# Patient Record
Sex: Male | Born: 1965 | Race: White | Hispanic: No | Marital: Married | Smoking: Never smoker
Health system: Southern US, Community
[De-identification: ages and names within clinical notes are randomized; demographics above are authoritative.]

## PROBLEM LIST (undated history)

## (undated) DIAGNOSIS — F419 Anxiety disorder, unspecified: Secondary | ICD-10-CM

## (undated) DIAGNOSIS — J339 Nasal polyp, unspecified: Secondary | ICD-10-CM

## (undated) DIAGNOSIS — M359 Systemic involvement of connective tissue, unspecified: Secondary | ICD-10-CM

## (undated) DIAGNOSIS — M069 Rheumatoid arthritis, unspecified: Secondary | ICD-10-CM

## (undated) DIAGNOSIS — E119 Type 2 diabetes mellitus without complications: Secondary | ICD-10-CM

## (undated) HISTORY — PX: WRIST SURGERY: SHX841

## (undated) HISTORY — PX: FOOT SURGERY: SHX648

## (undated) HISTORY — PX: NASAL POLYP EXCISION: SHX2068

---

## 2006-05-04 ENCOUNTER — Emergency Department: Payer: Self-pay | Admitting: Emergency Medicine

## 2006-12-28 ENCOUNTER — Ambulatory Visit: Payer: Self-pay | Admitting: Unknown Physician Specialty

## 2007-01-04 ENCOUNTER — Ambulatory Visit: Payer: Self-pay | Admitting: Unknown Physician Specialty

## 2007-01-27 ENCOUNTER — Emergency Department: Payer: Self-pay | Admitting: Unknown Physician Specialty

## 2007-03-07 ENCOUNTER — Ambulatory Visit: Payer: Self-pay | Admitting: Unknown Physician Specialty

## 2011-04-27 ENCOUNTER — Ambulatory Visit: Payer: Self-pay | Admitting: Internal Medicine

## 2011-07-24 ENCOUNTER — Ambulatory Visit: Payer: Self-pay | Admitting: Anesthesiology

## 2011-07-27 ENCOUNTER — Ambulatory Visit: Payer: Self-pay | Admitting: Otolaryngology

## 2011-08-05 LAB — WOUND CULTURE

## 2012-08-21 ENCOUNTER — Ambulatory Visit: Payer: Self-pay

## 2012-09-10 ENCOUNTER — Ambulatory Visit: Payer: Self-pay

## 2012-10-10 ENCOUNTER — Ambulatory Visit: Payer: Self-pay

## 2013-02-19 ENCOUNTER — Emergency Department: Payer: Self-pay | Admitting: Emergency Medicine

## 2013-06-19 ENCOUNTER — Ambulatory Visit: Payer: Self-pay | Admitting: Podiatry

## 2014-10-04 NOTE — Op Note (Signed)
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   POSTOPERATIVE DIAGNOSES: 1. 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 2. In addition, there was a cerebrospinal fluid leak from the right fovea ethmoidalis.  PROCEDURES:  1. Image-guided sinus surgery.  2. Revision bilateral frontal sinusotomies with tissue removal.  3. Revision bilateral anterior and posterior ethmoidectomies with tissue removal.  4. Revision bilateral sphenoidectomies with tissue removal.  5. Revision bilateral maxillary sinus antrostomies with tissue removal.  6. Repair of CSF leak at the right fovea ethmoidalis.   SURGEON: Janalee Dane, MD   DESCRIPTION OF PROCEDURE: The image-guided sinus surgery system mask was attached and registration was carried out in the standard fashion using the appropriate fiduciary points. Calibration of the system was confirmed and extensive review of the CT scan in all three dimensions preoperatively and intraoperatively was carried out.  Each instrument was registered and confirmed for anatomic accuracy.  The left side was addressed first. The sinuses were completely full of fibrous polyps and essentially no normal landmarks until removal of the polyps had been undertaken and navigation was very helpful in developing particularly the ethmoid cavity preserving the lamina papyracea and skull base. The polyps were removed with through-cutting forceps and the Diego powered instrumentation. The maxillary antrostomy was enlarged with a large side-cutting through cut instrument. After the ethmoid and maxillary sinus had been developed, the sphenoid sinus ostium was identified and progressively enlarged with Kerrison and through-cutting forceps. The 45 degree scope was used to perform the frontal recess.  The frontal recess was progressively enlarged to create a large frontal sinusotomy. The frontal ethmoid, maxillary, and sphenoid sinuses were copiously irrigated with saline and temporary Phenylephrine lidocaine soaked Telfa pledgets were placed.   Attention was directed to the right side. An identical procedure was performed in the ethmoid, maxillary, and sphenoid sinuses. The frontal sinus recess was again addressed with a 45 degree scope. As the frontal recess was developed, a CSF leak was identified behind a polyp that was based at the right fovea ethmoidalis. A flap of reasonably normal looking mucosa with a free bone graft was used to repair the leak. The leak was fairly small and the bone graft fit nicely in the defect with the flap presumably middle turbinate mucosa base posteriorly. Once the CSF leak had been repaired, it was reinforced with Surgiflo. No further leak was present. Therefore, the nose was lightly packed with Telfa pledgets tied over the columella.   The patient was returned to anesthesia, allowed to emerge from anesthesia in the operating room, and taken to the recovery room in stable condition. There were no complications. Estimated blood loss approximately 100 mL.   ____________________________ J. Nadeen Landau, MD [id number] D: 07/27/2011 21:41:43 ET T: 07/28/2011 10:35:33 ET JOB#: [id number]  cc: Janalee Dane, MD, <Dictator> Nicholos Johns MD ELECTRONICALLY SIGNED 08/14/2011 10:02

## 2016-09-29 ENCOUNTER — Encounter: Admission: RE | Payer: Self-pay | Source: Ambulatory Visit

## 2016-09-29 ENCOUNTER — Ambulatory Visit
Admission: RE | Admit: 2016-09-29 | Payer: BLUE CROSS/BLUE SHIELD | Source: Ambulatory Visit | Admitting: Gastroenterology

## 2016-09-29 SURGERY — COLONOSCOPY WITH PROPOFOL
Anesthesia: General

## 2016-10-02 ENCOUNTER — Emergency Department
Admission: EM | Admit: 2016-10-02 | Discharge: 2016-10-02 | Disposition: A | Discharge status: Home / Self Care | Payer: BLUE CROSS/BLUE SHIELD | Attending: Emergency Medicine | Admitting: Emergency Medicine

## 2016-10-02 ENCOUNTER — Emergency Department: Payer: BLUE CROSS/BLUE SHIELD

## 2016-10-02 ENCOUNTER — Encounter: Payer: Self-pay | Admitting: Emergency Medicine

## 2016-10-02 DIAGNOSIS — E876 Hypokalemia: Secondary | ICD-10-CM | POA: Insufficient documentation

## 2016-10-02 DIAGNOSIS — J329 Chronic sinusitis, unspecified: Secondary | ICD-10-CM | POA: Insufficient documentation

## 2016-10-02 DIAGNOSIS — E119 Type 2 diabetes mellitus without complications: Secondary | ICD-10-CM | POA: Insufficient documentation

## 2016-10-02 DIAGNOSIS — R232 Flushing: Secondary | ICD-10-CM | POA: Diagnosis not present

## 2016-10-02 DIAGNOSIS — Z7984 Long term (current) use of oral hypoglycemic drugs: Secondary | ICD-10-CM | POA: Diagnosis not present

## 2016-10-02 DIAGNOSIS — R202 Paresthesia of skin: Secondary | ICD-10-CM

## 2016-10-02 DIAGNOSIS — G8929 Other chronic pain: Secondary | ICD-10-CM | POA: Diagnosis not present

## 2016-10-02 DIAGNOSIS — M549 Dorsalgia, unspecified: Secondary | ICD-10-CM | POA: Insufficient documentation

## 2016-10-02 DIAGNOSIS — D1800 Hemangioma unspecified site: Secondary | ICD-10-CM | POA: Insufficient documentation

## 2016-10-02 HISTORY — DX: Type 2 diabetes mellitus without complications: E11.9

## 2016-10-02 HISTORY — DX: Rheumatoid arthritis, unspecified: M06.9

## 2016-10-02 HISTORY — DX: Anxiety disorder, unspecified: F41.9

## 2016-10-02 LAB — CBC WITH DIFFERENTIAL/PLATELET
Basophils Absolute: 0 10*3/uL (ref 0–0.1)
Basophils Relative: 1 %
EOS ABS: 0.1 10*3/uL (ref 0–0.7)
EOS PCT: 2 %
HCT: 45 % (ref 40.0–52.0)
Hemoglobin: 15.6 g/dL (ref 13.0–18.0)
LYMPHS ABS: 0.6 10*3/uL — AB (ref 1.0–3.6)
LYMPHS PCT: 14 %
MCH: 28.3 pg (ref 26.0–34.0)
MCHC: 34.5 g/dL (ref 32.0–36.0)
MCV: 82 fL (ref 80.0–100.0)
MONO ABS: 0.9 10*3/uL (ref 0.2–1.0)
Monocytes Relative: 19 %
Neutro Abs: 3 10*3/uL (ref 1.4–6.5)
Neutrophils Relative %: 64 %
PLATELETS: 141 10*3/uL — AB (ref 150–440)
RBC: 5.5 MIL/uL (ref 4.40–5.90)
RDW: 14.7 % — AB (ref 11.5–14.5)
WBC: 4.6 10*3/uL (ref 3.8–10.6)

## 2016-10-02 LAB — COMPREHENSIVE METABOLIC PANEL
ALT: 40 U/L (ref 17–63)
AST: 38 U/L (ref 15–41)
Albumin: 4.3 g/dL (ref 3.5–5.0)
Alkaline Phosphatase: 66 U/L (ref 38–126)
Anion gap: 10 (ref 5–15)
BUN: 15 mg/dL (ref 6–20)
CHLORIDE: 101 mmol/L (ref 101–111)
CO2: 26 mmol/L (ref 22–32)
CREATININE: 1.05 mg/dL (ref 0.61–1.24)
Calcium: 9 mg/dL (ref 8.9–10.3)
Glucose, Bld: 108 mg/dL — ABNORMAL HIGH (ref 65–99)
POTASSIUM: 2.9 mmol/L — AB (ref 3.5–5.1)
Sodium: 137 mmol/L (ref 135–145)
TOTAL PROTEIN: 7.8 g/dL (ref 6.5–8.1)
Total Bilirubin: 1.6 mg/dL — ABNORMAL HIGH (ref 0.3–1.2)

## 2016-10-02 LAB — ETHANOL

## 2016-10-02 LAB — URINE DRUG SCREEN, QUALITATIVE (ARMC ONLY)
AMPHETAMINES, UR SCREEN: NOT DETECTED
BARBITURATES, UR SCREEN: NOT DETECTED
BENZODIAZEPINE, UR SCRN: NOT DETECTED
Cannabinoid 50 Ng, Ur ~~LOC~~: NOT DETECTED
Cocaine Metabolite,Ur ~~LOC~~: NOT DETECTED
MDMA (Ecstasy)Ur Screen: NOT DETECTED
METHADONE SCREEN, URINE: NOT DETECTED
OPIATE, UR SCREEN: NOT DETECTED
Phencyclidine (PCP) Ur S: NOT DETECTED
TRICYCLIC, UR SCREEN: NOT DETECTED

## 2016-10-02 LAB — URINALYSIS, COMPLETE (UACMP) WITH MICROSCOPIC
BACTERIA UA: NONE SEEN
BILIRUBIN URINE: NEGATIVE
Glucose, UA: NEGATIVE mg/dL
Hgb urine dipstick: NEGATIVE
KETONES UR: NEGATIVE mg/dL
LEUKOCYTES UA: NEGATIVE
Nitrite: NEGATIVE
PH: 7 (ref 5.0–8.0)
PROTEIN: NEGATIVE mg/dL
SQUAMOUS EPITHELIAL / LPF: NONE SEEN
Specific Gravity, Urine: 1.012 (ref 1.005–1.030)

## 2016-10-02 LAB — TROPONIN I: Troponin I: <0.03 ng/mL (ref <0.03)

## 2016-10-02 LAB — LACTIC ACID, PLASMA
Lactic Acid, Venous: 2.2 mmol/L (ref 0.5–1.9)
Lactic Acid, Venous: 1.2 mmol/L (ref 0.5–1.9)

## 2016-10-02 LAB — MAGNESIUM: MAGNESIUM: 2 mg/dL (ref 1.7–2.4)

## 2016-10-02 LAB — GLUCOSE, CAPILLARY: Glucose-Capillary: 99 mg/dL (ref 65–99)

## 2016-10-02 MED ORDER — ACETAMINOPHEN 325 MG PO TABS
650.0000 mg | ORAL_TABLET | Freq: Once | ORAL | Status: AC
  Administered 2016-10-02: 650 mg via ORAL
  Filled 2016-10-02: qty 2

## 2016-10-02 MED ORDER — GADOBENATE DIMEGLUMINE 529 MG/ML IV SOLN
20.0000 mL | Freq: Once | INTRAVENOUS | Status: AC | PRN
  Administered 2016-10-02: 20 mL via INTRAVENOUS

## 2016-10-02 MED ORDER — POTASSIUM CHLORIDE CRYS ER 20 MEQ PO TBCR
40.0000 meq | EXTENDED_RELEASE_TABLET | Freq: Once | ORAL | Status: AC
  Administered 2016-10-02: 40 meq via ORAL
  Filled 2016-10-02: qty 2

## 2016-10-02 MED ORDER — SODIUM CHLORIDE 0.9 % IV BOLUS (SEPSIS)
1000.0000 mL | Freq: Once | INTRAVENOUS | Status: AC
Start: 2016-10-02 — End: 2016-10-02
  Administered 2016-10-02: 1000 mL via INTRAVENOUS

## 2016-10-02 NOTE — ED Notes (Signed)
Patient transported to MRI 

## 2016-10-02 NOTE — ED Notes (Signed)
ED Provider at bedside. 

## 2016-10-02 NOTE — ED Notes (Signed)
Pt sent from [place] for elav of possible prednisone induced dka. Pt alert.  Skin warm and dry  Pt states I feel numb  No chest pain or sob. Family with pt

## 2016-10-02 NOTE — Discharge Instructions (Signed)
Do not take prednisone, as he seemed not to be able to tolerated. Return to the emergency room for any new or worrisome symptoms including numbness or weakness, seizure, increased pain, shortness of breath, greenish discharge from your nasal cavities or you feel worse in any way. Follow closely with Dr. 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 If you feel unwell in any significant way we encourage and advised you  to return to the emergency department.

## 2016-10-02 NOTE — ED Notes (Signed)
Patient transported to X-ray 

## 2016-10-02 NOTE — ED Provider Notes (Addendum)
[facility] Emergency Department Provider Note  ____________________________________________   I have reviewed the triage vital signs and the nursing notes.   HISTORY  Chief Complaint Numbness and Back Pain    HPI John Stanton. is a 51 y.o. male who presents today with a host of different complaints. He was recent started on prednisone and hasn't had a few doses and he feels terrible he states. He feels washed out. He states that he has no energy. He does not want to eat or drink. Denies any fever or chills. Denies he nausea vomiting or diarrhea. Denies any numbness or weakness. Present was started for his sciatic pain. Sciatic is much better. Has chronic back pain, that feels better too. He states "I am not here for that" He denies any neurologic deficits such as incontinence of bowel or bladder. He states he did have tingling in both his fingertips on both sides but that's gone now. He states he was briefly constipated, well taking Percocet however he took MiraLAX and that is better. He has chronic sinus issues, he states he had a recent nosebleed but that stopped. He states he's had 2 sinus surgeries. He was on antibiotics earlier this months for sinus issues, and he feels that that is better. He has had a slight cough but no shortness of breath and no chest pain pleuritic or otherwise. Denies any lower extremity swelling. He states he just does not feel right. He is sent over here for rule out DKA according to notes but his glucose is 99. He has not had polyuria or polydipsia. He has chronic recurrent headaches he states for years and he is having a headache. Not worst headache of life, gradual onset. He denies any change in his chronic headaches and states this is a mild headache for him. He has been seen for his headaches in the past she states although he does not recall having imaging. He denies any change in vision although he states colors do not feel as vibrant  to him since he started taking the prednisone. He had no true allergic symptoms such as anaphylaxis or hives but he was reported to feel somewhat "flushed" after taking a shower yesterday which he attributes to the prednisone.     Past Medical History:  Diagnosis Date  . Anxiety   . Diabetes mellitus without complication ([place])   . Rheumatoid arthritis ([employer])     There are no active problems to display for this patient.   History reviewed. No pertinent surgical history.  Prior to Admission medications   Not on File    Allergies Patient has no allergy information on record.  No family history on file.  Social History Social History  Substance Use Topics  . Smoking status: Never Smoker  . Smokeless tobacco: Never Used  . Alcohol use No    Review of Systems Constitutional: No fever/chills Eyes: Blurry vision but does complain of colors seeming less fire ant ENT: No sore throat. No stiff neck no neck pain, no purulent nasal drainage, chronic sinus issues which seem to be in remission to him, had a nosebleed recently Cardiovascular: Denies chest pain. Respiratory: Denies shortness of breath. Said a slight cough Gastrointestinal:   no vomiting.  No diarrhea.  No constipation. Genitourinary: Negative for dysuria. Musculoskeletal: Negative lower extremity swelling Skin: Negative for rash. Neurological: Negative for severe headaches, focal weakness or numbness. 10-point ROS otherwise negative.  ____________________________________________   PHYSICAL EXAM:  VITAL SIGNS: ED Triage  Vitals [10/02/16 1715]  Enc Vitals Group     BP 135/80     Pulse Rate 76     Resp 17     Temp      Temp src      SpO2 96 %     Weight      Height      Head Circumference      Peak Flow      Pain Score      Pain Loc      Pain Edu?      Excl. in [place]?     Constitutional: Alert and oriented. Well appearing Nontoxic in appearance but seems anxious and upset  Eyes: Conjunctivae are  normal. PERRL. EOMI. Head: Atraumatic. Nose: No congestion/rhinnorhea. Mouth/Throat: Mucous membranes are moist.  Oropharynx non-erythematous. Neck: No stridor.   Nontender with no meningismus Cardiovascular: Normal rate, regular rhythm. Grossly normal heart sounds.  Good peripheral circulation. Respiratory: Normal respiratory effort.  No retractions. Lungs CTAB. Abdominal: Soft and nontender. No distention. No guarding no rebound Back:  There is no focal tenderness or step off.  there is no midline tenderness there are no lesions noted. there is no CVA tenderness Musculoskeletal: No lower extremity tenderness, no upper extremity tenderness. No joint effusions, no DVT signs strong distal pulses no edema Neurologic:  Normal speech and language. No gross focal neurologic deficits are appreciated.  Skin:  Skin is warm, dry and intact. No rash noted. Psychiatric: Mood and affect are normal. Speech and behavior are normal.  ____________________________________________   LABS (all labs ordered are listed, but only abnormal results are displayed)  Labs Reviewed  COMPREHENSIVE METABOLIC PANEL - Abnormal; Notable for the following:       Result Value   Potassium 2.9 (*)    Glucose, Bld 108 (*)    Total Bilirubin 1.6 (*)    All other components within normal limits  CBC WITH DIFFERENTIAL/PLATELET - Abnormal; Notable for the following:    RDW 14.7 (*)    Platelets 141 (*)    Lymphs Abs 0.6 (*)    All other components within normal limits  LACTIC ACID, PLASMA - Abnormal; Notable for the following:    Lactic Acid, Venous 2.2 (*)    All other components within normal limits  URINE CULTURE  GLUCOSE, CAPILLARY  ETHANOL  TROPONIN I  LACTIC ACID, PLASMA  URINALYSIS, COMPLETE (UACMP) WITH MICROSCOPIC  URINE DRUG SCREEN, QUALITATIVE ([facility] ONLY)  MAGNESIUM   ____________________________________________  EKG  I personally interpreted any EKGs ordered by me or triage Sinus rhythm rate 7077  acute ST elevation or acute ST depression normal axis, unremarkable EKG ____________________________________________  RADIOLOGY  I reviewed any imaging ordered by me or triage that were performed during my shift and, if possible, patient and/or family made aware of any abnormal findings. ____________________________________________   PROCEDURES  Procedure(s) performed: None  Procedures  Critical Care performed: None  ____________________________________________   INITIAL IMPRESSION / ASSESSMENT AND PLAN / ED COURSE  Pertinent labs & imaging results that were available during my care of the patient were reviewed by me and considered in my medical decision making (see chart for details).  Patient had a multiple different complaints most of them are somewhat chronic which she recently recent administration of prednisone. Patient does not have any evidence of anaphylaxis or significant allergic reaction. Given the very vague and diffuse nature of his complaints at a broad workup. I did a CT of his sinuses which does show what  is likely chronic sinus disease. Patient has had considerable sinus surgery and has no symptoms of ongoing sinusitis and recently was on antibiotics. He states when he has sinus disease he knows. There is no abscess. He feels he is at his baseline this first back. I don't think that necessarily finding thickening of the mucosal etc. is necessarily indicative of the need for antibiotics in this context. Patient's electrolyte are reassuring aside from his potassium which we're replacing. His bili is slightly elevated above baseline but not dangerously so, his abdomen is completely benign, CBC is reassuring. Troponin is negative despite symptoms since Saturday, ethanol is reassuring, chest x-ray is normal despite complaints of cough, patient does have a small 1 cm x 1 cm x 1 cm mass in his brain which I think is a completely incidental finding and not likely related to this  multiple different complaints. There is neurologically intact. His headache is slight and chronic. He may require an MRI not certain and has to happen today. Patient has had half a liter of fluid thus far and feels much better. No evidence of anaphylaxis we will continue to observe him. He does endorse feeling a great deal of improvement with fluids.  ----------------------------------------- 6:49 PM on 10/02/2016 -----------------------------------------  This he continues to feel better I did discuss with Dr. Lacinda Axon of neurosurgery who did request that I do an MRI of the head with and without contrast to further characterize this lesion which we will do. Again I think this is most likely an incidental finding. Nonetheless, we will comply with neurosurgical recommendation. Urinalysis is pending. Patient vital signs are reassuring and he is in no acute distress    ----------------------------------------- 9:19 PM on 10/02/2016 -----------------------------------------  Patient states he feels "just fine". I don't think that acute antibiotics are necessary for his chronic sinusitis even though there are clearly inflammatory changes is no evidence of active ongoing infection or abscess. 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 I did discuss with Dr. Lacinda Axon who agrees with discharge and will see him as an outpatient no further intervention is required for that. Patient apparently was feeling flushed after taking prednisone. I've advised him not to take it. He remains neurologically intact with no abdominal pain or discomfort no shortness of breath no chest pain, and extensive workup here is very reassuring. Patient's potassium is slightly low, we have begun to replete that here and we will send him home with supplementary potassium at home. Administration will follow closely with Dr. Doy Hutching tomorrow and he will follow closely with neurosurgery. He understands if he has a seizure or some  other new or concerning symptoms related to the cavernoma, which we don't anticipate given that was an incidental finding, he will return. He states after IV fluids most of his symptoms have resolved and he would like to go home. Patient for a to be admitted at this time and I don't see any clear indication to admit him anyway. We'll discharge him with outpatient follow-up and extensive return precautions given and understood.   ____________________________________________   FINAL CLINICAL IMPRESSION(S) / ED DIAGNOSES  Final diagnoses:  None      This chart was dictated using voice recognition software.  Despite best efforts to proofread,  errors can occur which can change meaning.      Schuyler Amor, MD 10/02/16 1817    Schuyler Amor, MD 10/02/16 [place], MD 10/02/16 [phone]  Schuyler Amor, MD 10/02/16 2120

## 2016-10-02 NOTE — ED Triage Notes (Signed)
Pt sent from Dr. Doy Hutching office for evaluation for possible steroid induced DKA. Pt reports severe back pain, weakness and nausea. Pt has been on prednisone. Pt has history of rheumatoid arthritis and diabetes.

## 2016-10-02 NOTE — ED Triage Notes (Signed)
Pt in via POV; sent over from PCP.  See previous note.  Pt with complaints of back pain, bilateral numbness/tingling to upper extremities.  Pt diaphoretic, continues to state, "I just need to lay down."  Pt taken to room at this time.  MD notified.

## 2016-10-04 LAB — URINE CULTURE: Culture: NO GROWTH

## 2017-01-10 ENCOUNTER — Other Ambulatory Visit: Payer: Self-pay | Admitting: Physical Medicine and Rehabilitation

## 2017-01-10 DIAGNOSIS — M5416 Radiculopathy, lumbar region: Secondary | ICD-10-CM

## 2017-01-18 ENCOUNTER — Ambulatory Visit: Payer: BLUE CROSS/BLUE SHIELD

## 2017-02-20 ENCOUNTER — Other Ambulatory Visit: Payer: Self-pay | Admitting: Physical Medicine and Rehabilitation

## 2017-02-20 ENCOUNTER — Ambulatory Visit
Admission: RE | Admit: 2017-02-20 | Discharge: 2017-02-20 | Disposition: A | Discharge status: Home / Self Care | Payer: BLUE CROSS/BLUE SHIELD | Source: Ambulatory Visit | Attending: Physical Medicine and Rehabilitation | Admitting: Physical Medicine and Rehabilitation

## 2017-02-20 DIAGNOSIS — M5416 Radiculopathy, lumbar region: Secondary | ICD-10-CM

## 2017-03-02 ENCOUNTER — Ambulatory Visit
Admission: RE | Admit: 2017-03-02 | Discharge: 2017-03-02 | Disposition: A | Discharge status: Home / Self Care | Payer: BLUE CROSS/BLUE SHIELD | Source: Ambulatory Visit | Attending: Physical Medicine and Rehabilitation | Admitting: Physical Medicine and Rehabilitation

## 2017-03-02 DIAGNOSIS — M5416 Radiculopathy, lumbar region: Secondary | ICD-10-CM

## 2017-07-31 ENCOUNTER — Encounter: Payer: Self-pay | Admitting: Emergency Medicine

## 2017-07-31 ENCOUNTER — Other Ambulatory Visit: Payer: Self-pay

## 2017-07-31 ENCOUNTER — Emergency Department
Admission: EM | Admit: 2017-07-31 | Discharge: 2017-07-31 | Disposition: A | Discharge status: Home / Self Care | Payer: BLUE CROSS/BLUE SHIELD | Attending: Emergency Medicine | Admitting: Emergency Medicine

## 2017-07-31 DIAGNOSIS — Z79899 Other long term (current) drug therapy: Secondary | ICD-10-CM | POA: Diagnosis not present

## 2017-07-31 DIAGNOSIS — Z7984 Long term (current) use of oral hypoglycemic drugs: Secondary | ICD-10-CM | POA: Insufficient documentation

## 2017-07-31 DIAGNOSIS — E119 Type 2 diabetes mellitus without complications: Secondary | ICD-10-CM | POA: Diagnosis not present

## 2017-07-31 DIAGNOSIS — R040 Epistaxis: Secondary | ICD-10-CM | POA: Insufficient documentation

## 2017-07-31 HISTORY — DX: Nasal polyp, unspecified: J33.9

## 2017-07-31 MED ORDER — OXYMETAZOLINE HCL 0.05 % NA SOLN
1.0000 | Freq: Once | NASAL | Status: AC
Start: 2017-07-31 — End: 2017-07-31
  Administered 2017-07-31: 1 via NASAL
  Filled 2017-07-31: qty 15

## 2017-07-31 MED ORDER — LIDOCAINE VISCOUS 2 % MT SOLN
OROMUCOSAL | Status: AC
  Filled 2017-07-31: qty 15

## 2017-07-31 MED ORDER — TRANEXAMIC ACID 1000 MG/10ML IV SOLN
500.0000 mg | Freq: Once | INTRAVENOUS | Status: AC
  Administered 2017-07-31: 500 mg via TOPICAL
  Filled 2017-07-31: qty 10

## 2017-07-31 NOTE — ED Provider Notes (Signed)
[facility] Emergency Department Provider Note  ____________________________________________   First MD Initiated Contact with Patient 07/31/17 0215     (approximate)  I have reviewed the triage vital signs and the nursing notes.   HISTORY  Chief Complaint Epistaxis    HPI John Stanton. is a 52 y.o. male medical history as listed below who presents for evaluation of acute onset severe right-sided nosebleed.  He says that he gets minor nosebleeds almost once a week but this 1 is severe and it is not stopping on his own as usually happens.  He has been applying pressure but the blood continues to pour out of his nose and down the back of his throat.  Nothing in particular makes his symptoms better or worse.  He denies any traumatic injury.  He has not had any changes to his medications.  He sees Dr. Doy Hutching for primary care and has discussed this problem with him but he has not gone back to ENT for a few years.  He reports a complicated ENT history.  About 16 years ago he had ENT surgery with a cleanout of polyps but that apparently led to a CSF leak that went undiagnosed for 13 years.  About 3 years ago the patient had surgery by Dr. Carlis Abbott who commented on extensive nasal polyps and was able to repair the CSF leak.  He thinks that he went back wants to see somebody with ENT but this was several years ago.  He believes "something else" is causing his recent nosebleeds but he cannot explain what he means.  He is on no blood thinners.  He denies lightheadedness and dizziness and is ambulating without difficulty.  He has had no nausea nor vomiting, no fever/chills, no chest pain, no shortness of breath.   Past Medical History:  Diagnosis Date  . Anxiety   . Diabetes mellitus without complication ([place])   . Multiple nasal polyps    required ENT surgery by Dr. Carlis Abbott in (approximately) 2016  . Rheumatoid arthritis ([place])     There are no active problems to display  for this patient.   Past Surgical History:  Procedure Laterality Date  . FOOT SURGERY    . NASAL POLYP EXCISION    . WRIST SURGERY      Prior to Admission medications   Medication Sig Start Date End Date Taking? Authorizing Provider  ALPRAZolam Duanne Moron) 0.5 MG tablet Take 0.5-1 mg by mouth at bedtime as needed for sleep. for sleep 09/11/16   [provider]  citalopram (CELEXA) 20 MG tablet Take 20 mg by mouth daily. 02/10/16   [provider]  diclofenac sodium (VOLTAREN) 1 % GEL Apply 2 g topically 4 (four) times daily. 08/23/16 08/23/17  [provider]  metFORMIN (GLUCOPHAGE) 500 MG tablet Take 500 mg by mouth 2 (two) times daily. 07/24/16   [provider]  montelukast (SINGULAIR) 10 MG tablet Take 10 mg by mouth daily. 11/17/14   [provider]  omeprazole (PRILOSEC) 20 MG capsule Take 20 mg by mouth daily as needed. 07/05/16   [provider]  oxyCODONE-acetaminophen (PERCOCET/ROXICET) 5-325 MG tablet Take 1 tablet by mouth every 4 (four) hours as needed for pain. 09/29/16   [provider]  pravastatin (PRAVACHOL) 40 MG tablet Take 40 mg by mouth daily. 08/06/16   [provider]  Tofacitinib Citrate (XELJANZ) 5 MG TABS Take 5 mg by mouth daily. 07/28/16   [provider]  Allergies Infliximab; Methotrexate; Venlafaxine; and Prednisone  History reviewed. No pertinent family history.  Social History Social History   Tobacco Use  . Smoking status: Never Smoker  . Smokeless tobacco: Never Used  Substance Use Topics  . Alcohol use: No  . Drug use: No    Review of Systems Constitutional: No fever/chills Eyes: No visual changes. ENT: Spontaneous severe right-sided nosebleed Cardiovascular: Denies chest pain. Respiratory: Denies shortness of breath. Gastrointestinal: No abdominal pain.  No nausea, no vomiting.  No diarrhea.  No constipation. Genitourinary: Negative for dysuria. Musculoskeletal:  Negative for neck pain.  Negative for back pain. Integumentary: Negative for rash. Neurological: Negative for headaches, focal weakness or numbness.   ____________________________________________   PHYSICAL EXAM:  VITAL SIGNS: ED Triage Vitals  Enc Vitals Group     BP 07/31/17 0143 (!) 157/97     Pulse Rate 07/31/17 0143 (!) 101     Resp 07/31/17 0143 20     Temp 07/31/17 0143 97.9 F (36.6 C)     Temp Source 07/31/17 0143 Oral     SpO2 07/31/17 0143 98 %     Weight 07/31/17 0140 116.1 kg (256 lb)     Height 07/31/17 0140 1.778 m (5\' 10" )     Head Circumference --      Peak Flow --      Pain Score --      Pain Loc --      Pain Edu? --      Excl. in [place]? --     Constitutional: Alert and oriented. Well appearing and in no acute distress. Eyes: Conjunctivae are normal.  Head: Atraumatic. Nose: Copious nosebleed from right nostril.  Cannot appreciate any specific site of the bleeding but it is profuse.  No involvement of the left nostril except that sometimes  When pressure is applied to the right side, the blood comes out the left.   Cardiovascular: Normal rate, regular rhythm. Good peripheral circulation. Grossly normal heart sounds. Respiratory: Normal respiratory effort.  No retractions. Lungs CTAB. Gastrointestinal: Soft and nontender. No distention.  Musculoskeletal: No lower extremity tenderness nor edema. No gross deformities of extremities. Neurologic:  Normal speech and language. No gross focal neurologic deficits are appreciated.  Skin:  Skin is warm, dry and intact. No rash noted. Psychiatric: Mood and affect are normal. Speech and behavior are normal.  ____________________________________________   LABS (all labs ordered are listed, but only abnormal results are displayed)  Labs Reviewed - No data to display ____________________________________________  EKG  None - EKG not ordered by ED  physician ____________________________________________  RADIOLOGY   ED MD interpretation: No imaging indicated  Official radiology report(s): No results found.  ____________________________________________   PROCEDURES  Critical Care performed: No   Procedure(s) performed:   .Epistaxis Management Date/Time: 07/31/2017 4:41 AM Performed by: Hinda Kehr, MD Authorized by: Hinda Kehr, MD   Consent:    Consent obtained:  Verbal   Consent given by:  Patient Procedure details:    Treatment site:  R anterior   Repair method: 2x2 gauze soaked with TXA.   Treatment complexity:  Limited   Treatment episode: initial   Post-procedure details:    Assessment:  Bleeding stopped   Patient tolerance of procedure:  Tolerated well, no immediate complications     ____________________________________________   INITIAL IMPRESSION / ASSESSMENT AND PLAN / ED COURSE  As part of my medical decision making, I reviewed the following data within the [facility] notes reviewed and  incorporated and Old chart reviewed    Differential diagnosis includes, but is not limited to, spontaneous bleed, arterial bleed, anterior versus posterior bleeding, neoplasm, sinus erosion, etc.  My initial focus will be on stopping the bleeding.  As an initial treatment, I had the patient blow his nose gently to clear out the clots, I applied two sprays of Afrin nasal spray, and then pushed in 2 x 2 gauze soaked in TXA.  I will reassess after 20-30 minutes but anticipate he may require a Merocel or rapid Rhino.  No indication of acute blood loss anemia or hemodynamic instability.  Clinical Course as of Jul 31 441  Tue Jul 31, 2017  0412 Bleeding stopped with TXA.  I removed the gauze and then waited about 30 minutes and he has had no additional epistaxis.  He states he wants to go home and go to bed.  I encouraged him to follow-up in about 4 hours with ENT and I will send a message to  Dr. Kathyrn Sheriff to try to expedite and facilitate close follow-up.  I gave my usual and customary return precautions.   [CF]    Clinical Course User Index [CF] Hinda Kehr, MD    ____________________________________________  FINAL CLINICAL IMPRESSION(S) / ED DIAGNOSES  Final diagnoses:  Right-sided epistaxis     MEDICATIONS GIVEN DURING THIS VISIT:  Medications  lidocaine (XYLOCAINE) 2 % viscous mouth solution (not administered)  tranexamic acid (CYKLOKAPRON) injection 500 mg (500 mg Topical Given 07/31/17 0250)  oxymetazoline (AFRIN) 0.05 % nasal spray 1 spray (1 spray Each Nare Given 07/31/17 0251)     ED Discharge Orders    None       Note:  This document was prepared using Dragon voice recognition software and may include unintentional dictation errors.    Hinda Kehr, MD 07/31/17 [phone]

## 2017-07-31 NOTE — ED Notes (Signed)
Pt has nosebleed from right nares.  Hx polyps. Nosebleed for 2 hours.   noseclip applied to nose.  Nose continues to bleed.  No headache.   md aware.  Family with pt

## 2017-07-31 NOTE — Discharge Instructions (Signed)
As we discussed, there are several techniques you can use to prevent or stop nosebleeds in the future.  Keep your nose moist either with saline spray several times a day or by applying a thin layer of Neosporin, bacitracin, or other antibiotic ointment to the inside of your nose once or twice a day.  Please do NOT blow your nose after you get home.  If the bleeding starts up again, gently blow your nose into a tissue to clear the blood and clots, then apply 1-2 sprays to each affected nostril of over-the-counter Afrin nasal spray (oxymetazoline).   Then squeeze your nose shut tightly and DO NOT PEEK for at least 15 minutes.  This will resolve most nosebleeds.  If you continue to have trouble after trying these techniques, or anything seems out of the ordinary or concerns you, please return tot he Emergency Department.  But please do follow up with the ENT clinic (Dr. Kathyrn Sheriff or any of his colleagues) first thing in the morning.

## 2017-07-31 NOTE — ED Triage Notes (Signed)
Pt presents to ED with right sided nose bleed for the past two hours. Hx of the same but pt states this is worse. nose bleeding heavily in triage.

## 2017-07-31 NOTE — ED Notes (Signed)
Right nares packed by md.  Pt tolerated well.  No bleeding at this time  family with pt.

## 2017-07-31 NOTE — ED Notes (Signed)
Report off to andrea rn  

## 2018-05-24 ENCOUNTER — Other Ambulatory Visit: Payer: Self-pay | Admitting: Obstetrics and Gynecology

## 2018-05-24 ENCOUNTER — Ambulatory Visit
Admission: RE | Admit: 2018-05-24 | Discharge: 2018-05-24 | Disposition: A | Discharge status: Home / Self Care | Payer: Disability Insurance | Source: Ambulatory Visit | Attending: Obstetrics and Gynecology | Admitting: Obstetrics and Gynecology

## 2018-05-24 DIAGNOSIS — M5126 Other intervertebral disc displacement, lumbar region: Secondary | ICD-10-CM

## 2018-05-24 DIAGNOSIS — M1990 Unspecified osteoarthritis, unspecified site: Secondary | ICD-10-CM

## 2018-06-30 ENCOUNTER — Emergency Department: Payer: BLUE CROSS/BLUE SHIELD

## 2018-06-30 ENCOUNTER — Inpatient Hospital Stay
Admission: EM | Admit: 2018-06-30 | Discharge: 2018-07-05 | DRG: 417 | Disposition: A | Discharge status: Home / Self Care | Payer: BLUE CROSS/BLUE SHIELD | Attending: Internal Medicine | Admitting: Internal Medicine

## 2018-06-30 ENCOUNTER — Other Ambulatory Visit: Payer: Self-pay

## 2018-06-30 ENCOUNTER — Encounter: Payer: Self-pay | Admitting: Radiology

## 2018-06-30 DIAGNOSIS — E119 Type 2 diabetes mellitus without complications: Secondary | ICD-10-CM | POA: Diagnosis present

## 2018-06-30 DIAGNOSIS — R945 Abnormal results of liver function studies: Secondary | ICD-10-CM | POA: Diagnosis present

## 2018-06-30 DIAGNOSIS — K851 Biliary acute pancreatitis without necrosis or infection: Secondary | ICD-10-CM | POA: Diagnosis present

## 2018-06-30 DIAGNOSIS — I700 Atherosclerosis of aorta: Secondary | ICD-10-CM | POA: Diagnosis present

## 2018-06-30 DIAGNOSIS — F419 Anxiety disorder, unspecified: Secondary | ICD-10-CM | POA: Diagnosis present

## 2018-06-30 DIAGNOSIS — R1084 Generalized abdominal pain: Secondary | ICD-10-CM

## 2018-06-30 DIAGNOSIS — I10 Essential (primary) hypertension: Secondary | ICD-10-CM | POA: Diagnosis present

## 2018-06-30 DIAGNOSIS — K8000 Calculus of gallbladder with acute cholecystitis without obstruction: Secondary | ICD-10-CM | POA: Diagnosis present

## 2018-06-30 DIAGNOSIS — K859 Acute pancreatitis without necrosis or infection, unspecified: Secondary | ICD-10-CM | POA: Diagnosis present

## 2018-06-30 DIAGNOSIS — I998 Other disorder of circulatory system: Secondary | ICD-10-CM | POA: Diagnosis present

## 2018-06-30 DIAGNOSIS — Z7984 Long term (current) use of oral hypoglycemic drugs: Secondary | ICD-10-CM

## 2018-06-30 DIAGNOSIS — Z79899 Other long term (current) drug therapy: Secondary | ICD-10-CM

## 2018-06-30 DIAGNOSIS — R112 Nausea with vomiting, unspecified: Secondary | ICD-10-CM

## 2018-06-30 DIAGNOSIS — M069 Rheumatoid arthritis, unspecified: Secondary | ICD-10-CM | POA: Diagnosis present

## 2018-06-30 DIAGNOSIS — Z888 Allergy status to other drugs, medicaments and biological substances status: Secondary | ICD-10-CM | POA: Diagnosis not present

## 2018-06-30 DIAGNOSIS — E876 Hypokalemia: Secondary | ICD-10-CM | POA: Diagnosis present

## 2018-06-30 DIAGNOSIS — K8020 Calculus of gallbladder without cholecystitis without obstruction: Secondary | ICD-10-CM

## 2018-06-30 DIAGNOSIS — R52 Pain, unspecified: Secondary | ICD-10-CM

## 2018-06-30 DIAGNOSIS — R109 Unspecified abdominal pain: Secondary | ICD-10-CM

## 2018-06-30 HISTORY — DX: Systemic involvement of connective tissue, unspecified: M35.9

## 2018-06-30 LAB — GLUCOSE, CAPILLARY
Glucose-Capillary: 134 mg/dL — ABNORMAL HIGH (ref 70–99)
Glucose-Capillary: 112 mg/dL — ABNORMAL HIGH (ref 70–99)
Glucose-Capillary: 105 mg/dL — ABNORMAL HIGH (ref 70–99)

## 2018-06-30 LAB — URINE DRUG SCREEN, QUALITATIVE (ARMC ONLY)
AMPHETAMINES, UR SCREEN: NOT DETECTED
BARBITURATES, UR SCREEN: NOT DETECTED
BENZODIAZEPINE, UR SCRN: NOT DETECTED
Cannabinoid 50 Ng, Ur ~~LOC~~: NOT DETECTED
Cocaine Metabolite,Ur ~~LOC~~: NOT DETECTED
MDMA (Ecstasy)Ur Screen: NOT DETECTED
METHADONE SCREEN, URINE: NOT DETECTED
OPIATE, UR SCREEN: POSITIVE — AB
Phencyclidine (PCP) Ur S: NOT DETECTED
Tricyclic, Ur Screen: NOT DETECTED

## 2018-06-30 LAB — CBC WITH DIFFERENTIAL/PLATELET
Abs Immature Granulocytes: 0.06 10*3/uL (ref 0.00–0.07)
BASOS ABS: 0.1 10*3/uL (ref 0.0–0.1)
Basophils Relative: 0 %
EOS ABS: 0.2 10*3/uL (ref 0.0–0.5)
Eosinophils Relative: 1 %
HEMATOCRIT: 43.6 % (ref 39.0–52.0)
Hemoglobin: 15.1 g/dL (ref 13.0–17.0)
Immature Granulocytes: 0 %
Lymphocytes Relative: 3 %
Lymphs Abs: 0.5 10*3/uL — ABNORMAL LOW (ref 0.7–4.0)
MCH: 27.9 pg (ref 26.0–34.0)
MCHC: 34.6 g/dL (ref 30.0–36.0)
MCV: 80.6 fL (ref 80.0–100.0)
MONOS PCT: 6 %
Monocytes Absolute: 0.9 10*3/uL (ref 0.1–1.0)
NEUTROS ABS: 14.8 10*3/uL — AB (ref 1.7–7.7)
NRBC: 0 % (ref 0.0–0.2)
Neutrophils Relative %: 90 %
Platelets: 301 10*3/uL (ref 150–400)
RBC: 5.41 MIL/uL (ref 4.22–5.81)
RDW: 13.9 % (ref 11.5–15.5)
WBC: 16.5 10*3/uL — AB (ref 4.0–10.5)

## 2018-06-30 LAB — ETHANOL

## 2018-06-30 LAB — COMPREHENSIVE METABOLIC PANEL
ALT: 150 U/L — ABNORMAL HIGH (ref 0–44)
ANION GAP: 13 (ref 5–15)
AST: 150 U/L — ABNORMAL HIGH (ref 15–41)
Albumin: 4.5 g/dL (ref 3.5–5.0)
Alkaline Phosphatase: 113 U/L (ref 38–126)
BILIRUBIN TOTAL: 3.8 mg/dL — AB (ref 0.3–1.2)
BUN: 16 mg/dL (ref 6–20)
CO2: 22 mmol/L (ref 22–32)
Calcium: 9 mg/dL (ref 8.9–10.3)
Chloride: 105 mmol/L (ref 98–111)
Creatinine, Ser: 1 mg/dL (ref 0.61–1.24)
Glucose, Bld: 186 mg/dL — ABNORMAL HIGH (ref 70–99)
POTASSIUM: 3.5 mmol/L (ref 3.5–5.1)
Sodium: 140 mmol/L (ref 135–145)
TOTAL PROTEIN: 7.5 g/dL (ref 6.5–8.1)

## 2018-06-30 LAB — URINALYSIS, COMPLETE (UACMP) WITH MICROSCOPIC
BILIRUBIN URINE: NEGATIVE
Bacteria, UA: NONE SEEN
Glucose, UA: 150 mg/dL — AB
HGB URINE DIPSTICK: NEGATIVE
Ketones, ur: 20 mg/dL — AB
LEUKOCYTES UA: NEGATIVE
NITRITE: NEGATIVE
PH: 8 (ref 5.0–8.0)
Protein, ur: NEGATIVE mg/dL
SPECIFIC GRAVITY, URINE: 1.03 (ref 1.005–1.030)
WBC, UA: NONE SEEN WBC/hpf (ref 0–5)

## 2018-06-30 LAB — LIPASE, BLOOD: LIPASE: 1850 U/L — AB (ref 11–51)

## 2018-06-30 LAB — SALICYLATE LEVEL: Salicylate Lvl: <7 mg/dL (ref 2.8–30.0)

## 2018-06-30 LAB — ACETAMINOPHEN LEVEL: Acetaminophen (Tylenol), Serum: <10 ug/mL — ABNORMAL LOW (ref 10–30)

## 2018-06-30 MED ORDER — INSULIN ASPART 100 UNIT/ML ~~LOC~~ SOLN: 0.0000 [IU] | Freq: Every day | SUBCUTANEOUS | Status: DC

## 2018-06-30 MED ORDER — SODIUM CHLORIDE 0.9 % IV BOLUS
1000.0000 mL | Freq: Once | INTRAVENOUS | Status: AC
Start: 2018-06-30 — End: 2018-06-30
  Administered 2018-06-30: 1000 mL via INTRAVENOUS

## 2018-06-30 MED ORDER — INSULIN ASPART 100 UNIT/ML ~~LOC~~ SOLN
0.0000 [IU] | Freq: Three times a day (TID) | SUBCUTANEOUS | Status: DC
  Administered 2018-06-30: 2 [IU] via SUBCUTANEOUS
  Filled 2018-06-30: qty 1

## 2018-06-30 MED ORDER — SODIUM CHLORIDE 0.9% FLUSH
3.0000 mL | Freq: Two times a day (BID) | INTRAVENOUS | Status: DC
  Administered 2018-06-30 – 2018-07-04 (×7): 3 mL via INTRAVENOUS

## 2018-06-30 MED ORDER — ONDANSETRON HCL 4 MG PO TABS: 4.0000 mg | ORAL_TABLET | Freq: Four times a day (QID) | ORAL | Status: DC | PRN

## 2018-06-30 MED ORDER — IOPAMIDOL (ISOVUE-300) INJECTION 61%: 30.0000 mL | Freq: Once | INTRAVENOUS | Status: DC | PRN

## 2018-06-30 MED ORDER — ENOXAPARIN SODIUM 40 MG/0.4ML ~~LOC~~ SOLN
40.0000 mg | SUBCUTANEOUS | Status: DC
  Administered 2018-06-30 – 2018-07-05 (×5): 40 mg via SUBCUTANEOUS
  Filled 2018-06-30 (×5): qty 0.4

## 2018-06-30 MED ORDER — HYDRALAZINE HCL 20 MG/ML IJ SOLN
5.0000 mg | Freq: Four times a day (QID) | INTRAMUSCULAR | Status: DC | PRN
  Administered 2018-06-30 – 2018-07-04 (×2): 5 mg via INTRAVENOUS
  Filled 2018-06-30 (×2): qty 1

## 2018-06-30 MED ORDER — HYDROMORPHONE HCL 1 MG/ML IJ SOLN
INTRAMUSCULAR | Status: AC
  Filled 2018-06-30: qty 1

## 2018-06-30 MED ORDER — ONDANSETRON HCL 4 MG/2ML IJ SOLN
4.0000 mg | Freq: Once | INTRAMUSCULAR | Status: AC
  Administered 2018-06-30: 4 mg via INTRAVENOUS
  Filled 2018-06-30: qty 2

## 2018-06-30 MED ORDER — LACTATED RINGERS IV SOLN
INTRAVENOUS | Status: DC
  Administered 2018-06-30 – 2018-07-04 (×8): via INTRAVENOUS

## 2018-06-30 MED ORDER — HYDROMORPHONE HCL 1 MG/ML IJ SOLN
0.5000 mg | Freq: Once | INTRAMUSCULAR | Status: AC
  Administered 2018-06-30: 0.5 mg via INTRAVENOUS

## 2018-06-30 MED ORDER — ONDANSETRON HCL 4 MG/2ML IJ SOLN: 4.0000 mg | Freq: Four times a day (QID) | INTRAMUSCULAR | Status: DC | PRN

## 2018-06-30 MED ORDER — HYDROMORPHONE HCL 1 MG/ML IJ SOLN
0.5000 mg | Freq: Once | INTRAMUSCULAR | Status: AC
  Administered 2018-06-30: 0.5 mg via INTRAVENOUS
  Filled 2018-06-30: qty 1

## 2018-06-30 MED ORDER — HYDROMORPHONE HCL 1 MG/ML IJ SOLN
1.0000 mg | INTRAMUSCULAR | Status: DC | PRN
  Administered 2018-06-30 – 2018-07-04 (×5): 1 mg via INTRAVENOUS
  Filled 2018-06-30 (×6): qty 1

## 2018-06-30 MED ORDER — IOHEXOL 300 MG/ML  SOLN: 100.0000 mL | Freq: Once | INTRAMUSCULAR | Status: DC | PRN

## 2018-06-30 MED ORDER — MORPHINE SULFATE (PF) 4 MG/ML IV SOLN
4.0000 mg | Freq: Once | INTRAVENOUS | Status: AC
  Administered 2018-06-30: 4 mg via INTRAVENOUS
  Filled 2018-06-30: qty 1

## 2018-06-30 MED ORDER — GADOBUTROL 1 MMOL/ML IV SOLN
10.0000 mL | Freq: Once | INTRAVENOUS | Status: AC | PRN
  Administered 2018-06-30: 10 mL via INTRAVENOUS

## 2018-06-30 MED ORDER — PANTOPRAZOLE SODIUM 40 MG IV SOLR
40.0000 mg | INTRAVENOUS | Status: DC
  Administered 2018-06-30 – 2018-07-05 (×6): 40 mg via INTRAVENOUS
  Filled 2018-06-30 (×6): qty 40

## 2018-06-30 MED ORDER — PIPERACILLIN-TAZOBACTAM 3.375 G IVPB
3.3750 g | Freq: Three times a day (TID) | INTRAVENOUS | Status: DC
  Administered 2018-06-30 – 2018-07-01 (×5): 3.375 g via INTRAVENOUS
  Filled 2018-06-30 (×5): qty 50

## 2018-06-30 MED ORDER — IOPAMIDOL (ISOVUE-370) INJECTION 76%
125.0000 mL | Freq: Once | INTRAVENOUS | Status: AC | PRN
  Administered 2018-06-30: 125 mL via INTRAVENOUS

## 2018-06-30 NOTE — Progress Notes (Signed)
Admitted for acute pancreatitis, keep n.p.o., continue IV hydration, agree with present treatment with general surgery consult. 2.  Diabetes mellitus: N.p.o., oral hypoglycemics to be held.  #3 leukocytosis, nonnecrotizing for pancreatitis by MRCP.  Empiric antibiotics

## 2018-06-30 NOTE — ED Notes (Signed)
Pt desat while getting ready to leave for imaging. Asleep. When woken O2 sat back to 99%.

## 2018-06-30 NOTE — ED Provider Notes (Signed)
[facility] Emergency Department Provider Note   ____________________________________________   First MD Initiated Contact with Patient 06/30/18 0116     (approximate)  I have reviewed the triage vital signs and the nursing notes.   HISTORY  Chief Complaint Abdominal Pain    HPI John Lamadrid Hemi Chacko. is a 53 y.o. male who presents to the ED from home with a chief complaint of abdominal pain, nausea and vomiting.  Patient drank some coffee yesterday morning and told his wife it did not "taste right".  Has been vomiting all day, approximately 8 times.  No diarrhea.  Last BM yesterday which was normal for patient.  Progressive generalized abdominal pain all day, now intolerable.  Patient denies associated fever, chills, chest pain, shortness of breath, dysuria.  Denies recent travel or trauma.   Past Medical History:  Diagnosis Date  . Anxiety   . Collagen vascular disease ([place])   . Diabetes mellitus without complication ([place])   . Multiple nasal polyps    required ENT surgery by Dr. Carlis Abbott in (approximately) 2016  . Rheumatoid arthritis ([place])     There are no active problems to display for this patient.   Past Surgical History:  Procedure Laterality Date  . FOOT SURGERY    . NASAL POLYP EXCISION    . WRIST SURGERY    No abdominal surgery  Prior to Admission medications   Medication Sig Start Date End Date Taking? Authorizing Provider  ALPRAZolam Duanne Moron) 0.5 MG tablet Take 0.5-1 mg by mouth at bedtime as needed for sleep. for sleep 09/11/16   [provider]  citalopram (CELEXA) 20 MG tablet Take 20 mg by mouth daily. 02/10/16   [provider]  metFORMIN (GLUCOPHAGE) 500 MG tablet Take 500 mg by mouth 2 (two) times daily. 07/24/16   [provider]  montelukast (SINGULAIR) 10 MG tablet Take 10 mg by mouth daily. 11/17/14   [provider]  omeprazole (PRILOSEC) 20 MG capsule Take 20 mg by mouth daily as needed. 07/05/16    [provider]  oxyCODONE-acetaminophen (PERCOCET/ROXICET) 5-325 MG tablet Take 1 tablet by mouth every 4 (four) hours as needed for pain. 09/29/16   [provider]  pravastatin (PRAVACHOL) 40 MG tablet Take 40 mg by mouth daily. 08/06/16   [provider]  Tofacitinib Citrate (XELJANZ) 5 MG TABS Take 5 mg by mouth daily. 07/28/16   [provider]    Allergies Infliximab; Methotrexate; Venlafaxine; and Prednisone  No family history on file.  Social History Social History   Tobacco Use  . Smoking status: Never Smoker  . Smokeless tobacco: Never Used  Substance Use Topics  . Alcohol use: No  . Drug use: No    Review of Systems  Constitutional: No fever/chills Eyes: No visual changes. ENT: No sore throat. Cardiovascular: Denies chest pain. Respiratory: Denies shortness of breath. Gastrointestinal: Positive for abdominal pain, nausea and vomiting.  No diarrhea.  No constipation. Genitourinary: Negative for dysuria. Musculoskeletal: Negative for back pain. Skin: Negative for rash. Neurological: Negative for headaches, focal weakness or numbness.   ____________________________________________   PHYSICAL EXAM:  VITAL SIGNS: ED Triage Vitals  Enc Vitals Group     BP      Pulse      Resp      Temp      Temp src      SpO2      Weight      Height      Head  Circumference      Peak Flow      Pain Score      Pain Loc      Pain Edu?      Excl. in [place]?     Constitutional: Alert and oriented.  Uncomfortable appearing and in moderate acute distress. Eyes: Conjunctivae are normal. PERRL. EOMI. Head: Atraumatic. Nose: No congestion/rhinnorhea. Mouth/Throat: Mucous membranes are moist.  Oropharynx non-erythematous. Neck: No stridor.   Cardiovascular: Normal rate, regular rhythm. Grossly normal heart sounds.  Good peripheral circulation. Respiratory: Normal respiratory effort.  No retractions. Lungs CTAB. Gastrointestinal: Soft and  diffusely tender to palpation without rebound or guarding. No distention. No abdominal bruits. No CVA tenderness. Musculoskeletal: No lower extremity tenderness nor edema.  No joint effusions. Neurologic:  Normal speech and language. No gross focal neurologic deficits are appreciated.  Skin:  Skin is warm, dry and intact. No rash noted. Psychiatric: Mood and affect are normal. Speech and behavior are normal.  ____________________________________________   LABS (all labs ordered are listed, but only abnormal results are displayed)  Labs Reviewed  CBC WITH DIFFERENTIAL/PLATELET - Abnormal; Notable for the following components:      Result Value   WBC 16.5 (*)    Neutro Abs 14.8 (*)    Lymphs Abs 0.5 (*)    All other components within normal limits  COMPREHENSIVE METABOLIC PANEL - Abnormal; Notable for the following components:   Glucose, Bld 186 (*)    AST 150 (*)    ALT 150 (*)    Total Bilirubin 3.8 (*)    All other components within normal limits  LIPASE, BLOOD - Abnormal; Notable for the following components:   Lipase 1,850 (*)    All other components within normal limits  URINALYSIS, COMPLETE (UACMP) WITH MICROSCOPIC - Abnormal; Notable for the following components:   Color, Urine YELLOW (*)    APPearance CLEAR (*)    Glucose, UA 150 (*)    Ketones, ur 20 (*)    All other components within normal limits  URINE DRUG SCREEN, QUALITATIVE ([facility] ONLY) - Abnormal; Notable for the following components:   Opiate, Ur Screen POSITIVE (*)    All other components within normal limits  ACETAMINOPHEN LEVEL - Abnormal; Notable for the following components:   Acetaminophen (Tylenol), Serum <10 (*)    All other components within normal limits  ETHANOL  SALICYLATE LEVEL   ____________________________________________  EKG  None ____________________________________________  RADIOLOGY  ED MD interpretation: CT scan demonstrates acute pancreatitis; dilated CBD, possible distal common  duct stone  Ultrasound unable to visualize CBD  MRCP demonstrates cholelithiasis, no choledocholithiasis  Official radiology report(s): Ct Abdomen Pelvis W Contrast  Result Date: 06/30/2018 CLINICAL DATA:  Nausea and vomiting with abdominal pain. EXAM: CT ABDOMEN AND PELVIS WITH CONTRAST TECHNIQUE: Multidetector CT imaging of the abdomen and pelvis was performed using the standard protocol following bolus administration of intravenous contrast. CONTRAST:  129mL ISOVUE-370 IOPAMIDOL (ISOVUE-370) INJECTION 76% COMPARISON:  None. FINDINGS: Lower chest: Clear lung bases. Normal heart size without pericardial or pleural effusion. Mild distal esophageal wall thickening, including on image 15/2. Hepatobiliary: Suspect mild hepatic steatosis, without focal liver lesion. Borderline gallbladder distension with edema in the region of the gallbladder neck, favored to be secondary to the more diffuse process below. The common duct is dilated for age at 11 mm on image 47/5. Possible distal common duct stone of on the order of 8 mm on image 40/2. Pancreas: Peripancreatic edema, primarily about the head and neck.  Edema also identified adjacent the descending duodenum. The pancreas enhances normally. No pancreatic duct dilatation. Spleen: Normal in size, without focal abnormality. Adrenals/Urinary Tract: Normal adrenal glands. Lower pole right renal subcentimeter cyst. Too small to characterize left renal lesion, without hydronephrosis. Normal urinary bladder. Stomach/Bowel: Normal stomach, without wall thickening. Tiny periampullary duodenal diverticulum. Scattered colonic diverticula. Normal terminal ileum and appendix. Otherwise normal small bowel. Vascular/Lymphatic: Aortic and branch vessel atherosclerosis. Patent portal and splenic veins. No abdominopelvic adenopathy. Reproductive: Normal prostate. Other: No significant free fluid. Musculoskeletal: Disc bulge at the lumbosacral junction IMPRESSION: 1. Peripancreatic  edema, most consistent with acute pancreatitis. Presumably secondary edema adjacent the descending duodenum and gallbladder. Duodenitis and/or cholecystitis cannot be excluded. 2. Common duct dilatation for age with possible distal common duct stone. Correlate with bilirubin levels. If these are elevated, ERCP or MRCP should be considered. 3. Distal esophageal wall thickening, suspicious for esophagitis. 4. Probable mild hepatic steatosis. 5.  Aortic Atherosclerosis (ICD10-I70.0).  This is age advanced. Electronically Signed   By: Abigail Miyamoto M.D.   On: 06/30/2018 02:33   Mr 3d Recon At Scanner  Result Date: 06/30/2018 CLINICAL DATA:  Acute pancreatitis and dilated common bile duct. Abdominal pain. Elevated bilirubin. EXAM: MRI ABDOMEN WITHOUT AND WITH CONTRAST (INCLUDING MRCP) TECHNIQUE: Multiplanar multisequence MR imaging of the abdomen was performed both before and after the administration of intravenous contrast. Heavily T2-weighted images of the biliary and pancreatic ducts were obtained, and three-dimensional MRCP images were rendered by post processing. CONTRAST:  10 cc Gadavist IV. COMPARISON:  06/30/2018 right upper quadrant abdominal sonogram and CT abdomen/pelvis. FINDINGS: Lower chest: Hypoventilatory changes at the dependent lung bases. Mild circumferential wall thickening in the lower thoracic esophagus. Hepatobiliary: Normal liver size and configuration. Diffuse hepatic steatosis. No liver mass. Mildly distended gallbladder with mild diffuse gallbladder wall thickening. Mild pericholecystic edema. A few subcentimeter layering gallstones, largest 4 mm. No intrahepatic biliary ductal dilatation. Common bile duct diameter 8 mm. Smooth distal tapering of the CBD with no biliary filling defects, strictures or masses. Small periampullary duodenal diverticulum. Pancreas: There is diffuse pancreatic parenchymal and peripancreatic edema compatible with acute pancreatitis. No measurable peripancreatic  fluid collections. No pancreatic mass or duct dilation. Preserved pancreatic parenchymal enhancement. No pancreas divisum. Spleen: Normal size. No mass. Adrenals/Urinary Tract: Normal adrenals. No hydronephrosis. Simple 1.3 cm lower right renal cyst. Subcentimeter simple lower left renal cyst. No suspicious renal masses. Stomach/Bowel: Normal non-distended stomach. Visualized small and large bowel is normal caliber, with no bowel wall thickening. Vascular/Lymphatic: Normal caliber abdominal aorta. Patent portal, splenic, hepatic and renal veins. No pathologically enlarged lymph nodes in the abdomen. Other: No abdominal ascites or focal fluid collection. Musculoskeletal: No aggressive appearing focal osseous lesions. IMPRESSION: 1. Cholelithiasis. Nonspecific mild diffuse gallbladder wall thickening and mild gallbladder distention with mild pericholecystic edema. Acute cholecystitis can not be excluded by MRI, although these findings could be reactive to the acute pancreatitis. 2. Acute non-necrotizing pancreatitis. 3. Mildly dilated common bile duct (8 mm diameter) with no intrahepatic biliary ductal dilatation. No evidence of choledocholithiasis. Small periampullary duodenal diverticulum. 4. Diffuse hepatic steatosis. 5. Nonspecific mild circumferential wall thickening in the lower thoracic esophagus, most commonly due to reflux esophagitis, although Barrett's esophagus or esophageal neoplasm can not be excluded by imaging. Decision to pursue further evaluation should be based on clinical assessment. Electronically Signed   By: Ilona Sorrel M.D.   On: 06/30/2018 06:09   Mr Abdomen Mrcp Moise Boring Contast  Result Date: 06/30/2018 CLINICAL DATA:  Acute pancreatitis and dilated common bile duct. Abdominal pain. Elevated bilirubin. EXAM: MRI ABDOMEN WITHOUT AND WITH CONTRAST (INCLUDING MRCP) TECHNIQUE: Multiplanar multisequence MR imaging of the abdomen was performed both before and after the administration of  intravenous contrast. Heavily T2-weighted images of the biliary and pancreatic ducts were obtained, and three-dimensional MRCP images were rendered by post processing. CONTRAST:  10 cc Gadavist IV. COMPARISON:  06/30/2018 right upper quadrant abdominal sonogram and CT abdomen/pelvis. FINDINGS: Lower chest: Hypoventilatory changes at the dependent lung bases. Mild circumferential wall thickening in the lower thoracic esophagus. Hepatobiliary: Normal liver size and configuration. Diffuse hepatic steatosis. No liver mass. Mildly distended gallbladder with mild diffuse gallbladder wall thickening. Mild pericholecystic edema. A few subcentimeter layering gallstones, largest 4 mm. No intrahepatic biliary ductal dilatation. Common bile duct diameter 8 mm. Smooth distal tapering of the CBD with no biliary filling defects, strictures or masses. Small periampullary duodenal diverticulum. Pancreas: There is diffuse pancreatic parenchymal and peripancreatic edema compatible with acute pancreatitis. No measurable peripancreatic fluid collections. No pancreatic mass or duct dilation. Preserved pancreatic parenchymal enhancement. No pancreas divisum. Spleen: Normal size. No mass. Adrenals/Urinary Tract: Normal adrenals. No hydronephrosis. Simple 1.3 cm lower right renal cyst. Subcentimeter simple lower left renal cyst. No suspicious renal masses. Stomach/Bowel: Normal non-distended stomach. Visualized small and large bowel is normal caliber, with no bowel wall thickening. Vascular/Lymphatic: Normal caliber abdominal aorta. Patent portal, splenic, hepatic and renal veins. No pathologically enlarged lymph nodes in the abdomen. Other: No abdominal ascites or focal fluid collection. Musculoskeletal: No aggressive appearing focal osseous lesions. IMPRESSION: 1. Cholelithiasis. Nonspecific mild diffuse gallbladder wall thickening and mild gallbladder distention with mild pericholecystic edema. Acute cholecystitis can not be excluded by  MRI, although these findings could be reactive to the acute pancreatitis. 2. Acute non-necrotizing pancreatitis. 3. Mildly dilated common bile duct (8 mm diameter) with no intrahepatic biliary ductal dilatation. No evidence of choledocholithiasis. Small periampullary duodenal diverticulum. 4. Diffuse hepatic steatosis. 5. Nonspecific mild circumferential wall thickening in the lower thoracic esophagus, most commonly due to reflux esophagitis, although Barrett's esophagus or esophageal neoplasm can not be excluded by imaging. Decision to pursue further evaluation should be based on clinical assessment. Electronically Signed   By: Ilona Sorrel M.D.   On: 06/30/2018 06:09   US Abdomen Limited Ruq  Result Date: 06/30/2018 CLINICAL DATA:  53 year old male with abdominal pain, nausea vomiting. Elevated LFTs. EXAM: ULTRASOUND ABDOMEN LIMITED RIGHT UPPER QUADRANT COMPARISON:  CT of the abdomen pelvis dated 06/30/2018 FINDINGS: Evaluation is limited due to patient's body habitus and overlying bowel gas. Gallbladder: No gallstones or wall thickening visualized. No sonographic Murphy sign noted by sonographer. Common bile duct: Evaluation of the CBD is very limited due to overlying bowel gas. The central CBD is suboptimally visualized and not well evaluated. Diameter: 9 mm. Liver: There is diffuse increased liver echogenicity most consistent with fatty infiltration. Superimposed infection or fibrosis is not excluded. Portal vein is patent on color Doppler imaging with normal direction of blood flow towards the liver. IMPRESSION: 1. No gallstones or sonographic findings of acute cholecystitis. 2. Mildly dilated CBD. The central CBD is not visualized and obscured by bowel gas. If there remains concern for central CBD stone further evaluation with MRCP is recommended. 3. Fatty liver. Electronically Signed   By: Anner Crete M.D.   On: 06/30/2018 03:52     ____________________________________________   PROCEDURES  Procedure(s) performed: None  Procedures  Critical Care performed: Yes, see critical care note(s)   CRITICAL  CARE Performed by: Paulette Blanch   Total critical care time: 60 minutes  Critical care time was exclusive of separately billable procedures and treating other patients.  Critical care was necessary to treat or prevent imminent or life-threatening deterioration.  Critical care was time spent personally by me on the following activities: development of treatment plan with patient and/or surrogate as well as nursing, discussions with consultants, evaluation of patient's response to treatment, examination of patient, obtaining history from patient or surrogate, ordering and performing treatments and interventions, ordering and review of laboratory studies, ordering and review of radiographic studies, pulse oximetry and re-evaluation of patient's condition.  ____________________________________________   INITIAL IMPRESSION / ASSESSMENT AND PLAN / ED COURSE  As part of my medical decision making, I reviewed the following data within the [place] History obtained from family, Nursing notes reviewed and incorporated, Labs reviewed, Old chart reviewed, Radiograph reviewed and Notes from prior ED visits   53 year old male who presents with generalized abdominal pain, nausea and vomiting.  Differential diagnosis includes, but is not limited to, cholecystitis, PUD, acute appendicitis, renal colic, testicular torsion, urinary tract infection/pyelonephritis, prostatitis,  epididymitis, diverticulitis, small bowel obstruction or ileus, colitis, abdominal aortic aneurysm, gastroenteritis, hernia, etc.  Will obtain screening lab work and urinalysis.  Initiate IV fluid resuscitation, 4 mg IV morphine for pain, paired with 4 mg IV Zofran for nausea.  Proceed with CT abdomen/pelvis to evaluate for intra-abdominal  etiology of patient's symptoms.   Clinical Course as of Jun 30 621  Sun Jun 30, 2018  0157 Patient refusing to drink oral contrast for CT scan.  Will scan with IV contrast only.  LFTs noted.  We will also obtain ultrasound.   [JS]  0301 Patient resting more comfortably after additional IV Dilaudid.  Updated patient and spouse of CT scan.  He will go for ultrasound shortly.  Depending on ultrasound results, and whether we have ERCP capabilities this weekend, patient will require either admission or transfer.   [JS]  3710 Ultrasound unable to visualize CBD.  Will proceed with MRCP.  Patient sleeping.   [JS]  [id number] Patient back from MRI, requesting more pain medicines.  We will also will infuse additional IV fluids.   [JS]  [id number] Updated patient and spouse on MRI results.  Have discussed case with hospitalist Dr. Posey Pronto who will evaluate patient in the emergency department for admission.   [JS]    Clinical Course User Index [JS] Paulette Blanch, MD     ____________________________________________   FINAL CLINICAL IMPRESSION(S) / ED DIAGNOSES  Final diagnoses:  Generalized abdominal pain  Non-intractable vomiting with nausea, unspecified vomiting type  Acute pancreatitis, unspecified complication status, unspecified pancreatitis type  Intractable pain  Calculus of gallbladder without cholecystitis without obstruction  Acute gallstone pancreatitis     ED Discharge Orders    None       Note:  This document was prepared using Dragon voice recognition software and may include unintentional dictation errors.    Paulette Blanch, MD 06/30/18 [phone]

## 2018-06-30 NOTE — ED Notes (Signed)
Pt reports his pain is increasing again. Rates at 8/10. Will ask MD for pain medication.

## 2018-06-30 NOTE — ED Notes (Signed)
Pt came into the lobby and laid himself down in the floor by stat registration; says he thinks he has food poisoning; N/V with severe abd pain; pt was able to get himself up and into wheelchair; continues to say "hurry up"; "somebody help me"; "I need to lay down";

## 2018-06-30 NOTE — ED Notes (Signed)
Pt returned from CT °

## 2018-06-30 NOTE — ED Notes (Signed)
Pt returned from MRI °

## 2018-06-30 NOTE — Consult Note (Signed)
Subjective:   CC: Gallstone pancreatitis  HPI:  John Stanton. is a 53 y.o. male who is consulted by Posey Pronto for evaluation of above cc.  Symptoms were first noted 1 day ago. Pain is sharp, constant, located in the epigastric region.  Associated with nausea, exacerbated by nothing specific.  ED work-up noted gallstone pancreatitis therefore admitted under hospitalist.  Surgery consulted for possible lap chole during this admission.     Past Medical History:  has a past medical history of Anxiety, Collagen vascular disease ([place]), Diabetes mellitus without complication ([place]), Multiple nasal polyps, and Rheumatoid arthritis ([place]).  Past Surgical History:  has a past surgical history that includes Nasal polyp excision; Wrist surgery; and Foot surgery.  Family History: Reviewed and not relevant to chief complaint  Social History:  reports that he has never smoked. He has never used smokeless tobacco. He reports that he does not drink alcohol or use drugs.  Current Medications:  Medications Prior to Admission  Medication Sig Dispense Refill  . ALPRAZolam (XANAX) 0.5 MG tablet Take 0.5-1 mg by mouth at bedtime as needed for sleep. for sleep  5  . bisoprolol-hydrochlorothiazide (ZIAC) 5-6.25 MG tablet Take 1 tablet by mouth daily.    . celecoxib (CELEBREX) 200 MG capsule Take 1 capsule by mouth daily.    . citalopram (CELEXA) 20 MG tablet Take 20 mg by mouth daily.    . DULoxetine (CYMBALTA) 30 MG capsule Take 60 mg by mouth daily.    . pravastatin (PRAVACHOL) 40 MG tablet Take 40 mg by mouth daily.  3  . tiZANidine (ZANAFLEX) 4 MG tablet Take 1 tablet by mouth at bedtime as needed.    . Tofacitinib Citrate (XELJANZ) 5 MG TABS Take 5 mg by mouth daily.      Allergies:  Allergies as of 06/30/2018 - Review Complete 06/30/2018  Allergen Reaction Noted  . Infliximab Other (See Comments) 08/30/2013  . Methotrexate Other (See Comments) 08/30/2013  . Venlafaxine Other (See Comments) 08/30/2013   . Prednisone Rash 10/02/2016    ROS:  General: Denies weight loss, weight gain, fatigue, fevers, chills, and night sweats. Eyes: Denies blurry vision, double vision, eye pain, itchy eyes, and tearing. Ears: Denies hearing loss, earache, and ringing in ears. Nose: Denies sinus pain, congestion, infections, runny nose, and nosebleeds. Mouth/throat: Denies hoarseness, sore throat, bleeding gums, and difficulty swallowing. Heart: Denies chest pain, palpitations, racing heart, irregular heartbeat, leg pain or swelling, and decreased activity tolerance. Respiratory: Denies breathing difficulty, shortness of breath, wheezing, cough, and sputum. GI: Denies constipation, diarrhea, and blood in stool. GU: Denies difficulty urinating, pain with urinating, urgency, frequency, blood in urine. Musculoskeletal: Denies muscle weakness, and pain. Skin: Denies rash, itching, mass, tumors, sores, and boils Neurologic: Denies headache, fainting, dizziness, seizures, numbness, and tingling. Psychiatric: Denies depression, anxiety, difficulty sleeping, and memory loss. Endocrine: Denies heat or cold intolerance, and increased thirst or urination. Blood/lymph: Denies easy bruising, easy bruising, and swollen glands   Objective:     BP (!) 143/86   Pulse 89   Temp 97.9 F (36.6 C) (Oral)   Resp 18   Ht 5\' 10"  (1.778 m)   Wt 117.9 kg   SpO2 99%   BMI 37.31 kg/m    Constitutional :  alert, cooperative, appears stated age and no distress  Lymphatics/Throat:  no asymmetry, masses, or scars  Respiratory:  clear to auscultation bilaterally  Cardiovascular:  regular rate and rhythm, S1, S2 normal, no murmur, click, rub or gallop  Gastrointestinal: Soft, no guarding, but focal tenderness noted in the epigastric region, with some tenderness also in the right upper quadrant.   Musculoskeletal: Steady gait and movement  Skin: Cool and moist  Psychiatric: Normal affect, non-agitated, not confused        LABS:  CMP Latest Ref Rng & Units 06/30/2018 10/02/2016  Glucose 70 - 99 mg/dL 186(H) 108(H)  BUN 6 - 20 mg/dL 16 15  Creatinine 0.61 - 1.24 mg/dL 1.00 1.05  Sodium 135 - 145 mmol/L 140 137  Potassium 3.5 - 5.1 mmol/L 3.5 2.9(L)  Chloride 98 - 111 mmol/L 105 101  CO2 22 - 32 mmol/L 22 26  Calcium 8.9 - 10.3 mg/dL 9.0 9.0  Total Protein 6.5 - 8.1 g/dL 7.5 7.8  Total Bilirubin 0.3 - 1.2 mg/dL 3.8(H) 1.6(H)  Alkaline Phos 38 - 126 U/L 113 66  AST 15 - 41 U/L 150(H) 38  ALT 0 - 44 U/L 150(H) 40   CBC Latest Ref Rng & Units 06/30/2018 10/02/2016  WBC 4.0 - 10.5 K/uL 16.5(H) 4.6  Hemoglobin 13.0 - 17.0 g/dL 15.1 15.6  Hematocrit 39.0 - 52.0 % 43.6 45.0  Platelets 150 - 400 K/uL 301 141(L)     RADS: CLINICAL DATA:  Nausea and vomiting with abdominal pain.  EXAM: CT ABDOMEN AND PELVIS WITH CONTRAST  TECHNIQUE: Multidetector CT imaging of the abdomen and pelvis was performed using the standard protocol following bolus administration of intravenous contrast.  CONTRAST:  154mL ISOVUE-370 IOPAMIDOL (ISOVUE-370) INJECTION 76%  COMPARISON:  None.  FINDINGS: Lower chest: Clear lung bases. Normal heart size without pericardial or pleural effusion. Mild distal esophageal wall thickening, including on image 15/2.  Hepatobiliary: Suspect mild hepatic steatosis, without focal liver lesion. Borderline gallbladder distension with edema in the region of the gallbladder neck, favored to be secondary to the more diffuse process below.  The common duct is dilated for age at 11 mm on image 47/5. Possible distal common duct stone of on the order of 8 mm on image 40/2.  Pancreas: Peripancreatic edema, primarily about the head and neck. Edema also identified adjacent the descending duodenum. The pancreas enhances normally. No pancreatic duct dilatation.  Spleen: Normal in size, without focal abnormality.  Adrenals/Urinary Tract: Normal adrenal glands. Lower pole right renal  subcentimeter cyst. Too small to characterize left renal lesion, without hydronephrosis. Normal urinary bladder.  Stomach/Bowel: Normal stomach, without wall thickening. Tiny periampullary duodenal diverticulum. Scattered colonic diverticula. Normal terminal ileum and appendix. Otherwise normal small bowel.  Vascular/Lymphatic: Aortic and branch vessel atherosclerosis. Patent portal and splenic veins. No abdominopelvic adenopathy.  Reproductive: Normal prostate.  Other: No significant free fluid.  Musculoskeletal: Disc bulge at the lumbosacral junction  IMPRESSION: 1. Peripancreatic edema, most consistent with acute pancreatitis. Presumably secondary edema adjacent the descending duodenum and gallbladder. Duodenitis and/or cholecystitis cannot be excluded. 2. Common duct dilatation for age with possible distal common duct stone. Correlate with bilirubin levels. If these are elevated, ERCP or MRCP should be considered. 3. Distal esophageal wall thickening, suspicious for esophagitis. 4. Probable mild hepatic steatosis. 5.  Aortic Atherosclerosis (ICD10-I70.0).  This is age advanced.   Electronically Signed   By: Abigail Miyamoto M.D.   On: 06/30/2018 02:33  CLINICAL DATA:  53 year old male with abdominal pain, nausea vomiting. Elevated LFTs.  EXAM: ULTRASOUND ABDOMEN LIMITED RIGHT UPPER QUADRANT  COMPARISON:  CT of the abdomen pelvis dated 06/30/2018  FINDINGS: Evaluation is limited due to patient's body habitus and overlying bowel gas.  Gallbladder:  No gallstones or wall thickening visualized. No sonographic Murphy sign noted by sonographer.  Common bile duct: Evaluation of the CBD is very limited due to overlying bowel gas. The central CBD is suboptimally visualized and not well evaluated.  Diameter: 9 mm.  Liver:  There is diffuse increased liver echogenicity most consistent with fatty infiltration. Superimposed infection or fibrosis is  not excluded. Portal vein is patent on color Doppler imaging with normal direction of blood flow towards the liver.  IMPRESSION: 1. No gallstones or sonographic findings of acute cholecystitis. 2. Mildly dilated CBD. The central CBD is not visualized and obscured by bowel gas. If there remains concern for central CBD stone further evaluation with MRCP is recommended. 3. Fatty liver.   Electronically Signed   By: Anner Crete M.D.   On: 06/30/2018 03:52   CLINICAL DATA:  Acute pancreatitis and dilated common bile duct. Abdominal pain. Elevated bilirubin.  EXAM: MRI ABDOMEN WITHOUT AND WITH CONTRAST (INCLUDING MRCP)  TECHNIQUE: Multiplanar multisequence MR imaging of the abdomen was performed both before and after the administration of intravenous contrast. Heavily T2-weighted images of the biliary and pancreatic ducts were obtained, and three-dimensional MRCP images were rendered by post processing.  CONTRAST:  10 cc Gadavist IV.  COMPARISON:  06/30/2018 right upper quadrant abdominal sonogram and CT abdomen/pelvis.  FINDINGS: Lower chest: Hypoventilatory changes at the dependent lung bases. Mild circumferential wall thickening in the lower thoracic esophagus.  Hepatobiliary: Normal liver size and configuration. Diffuse hepatic steatosis. No liver mass. Mildly distended gallbladder with mild diffuse gallbladder wall thickening. Mild pericholecystic edema. A few subcentimeter layering gallstones, largest 4 mm. No intrahepatic biliary ductal dilatation. Common bile duct diameter 8 mm. Smooth distal tapering of the CBD with no biliary filling defects, strictures or masses. Small periampullary duodenal diverticulum.  Pancreas: There is diffuse pancreatic parenchymal and peripancreatic edema compatible with acute pancreatitis. No measurable peripancreatic fluid collections. No pancreatic mass or duct dilation. Preserved pancreatic parenchymal enhancement.  No pancreas divisum.  Spleen: Normal size. No mass.  Adrenals/Urinary Tract: Normal adrenals. No hydronephrosis. Simple 1.3 cm lower right renal cyst. Subcentimeter simple lower left renal cyst. No suspicious renal masses.  Stomach/Bowel: Normal non-distended stomach. Visualized small and large bowel is normal caliber, with no bowel wall thickening.  Vascular/Lymphatic: Normal caliber abdominal aorta. Patent portal, splenic, hepatic and renal veins. No pathologically enlarged lymph nodes in the abdomen.  Other: No abdominal ascites or focal fluid collection.  Musculoskeletal: No aggressive appearing focal osseous lesions.  IMPRESSION: 1. Cholelithiasis. Nonspecific mild diffuse gallbladder wall thickening and mild gallbladder distention with mild pericholecystic edema. Acute cholecystitis can not be excluded by MRI, although these findings could be reactive to the acute pancreatitis. 2. Acute non-necrotizing pancreatitis. 3. Mildly dilated common bile duct (8 mm diameter) with no intrahepatic biliary ductal dilatation. No evidence of choledocholithiasis. Small periampullary duodenal diverticulum. 4. Diffuse hepatic steatosis. 5. Nonspecific mild circumferential wall thickening in the lower thoracic esophagus, most commonly due to reflux esophagitis, although Barrett's esophagus or esophageal neoplasm can not be excluded by imaging. Decision to pursue further evaluation should be based on clinical assessment.   Electronically Signed   By: Ilona Sorrel M.D.   On: 06/30/2018 06:09 Assessment:      Gallstone pancreatitis, MRCP negative for choledocholithiasis but incidental note of esophageal wall thickening lower thoracic area  Plan:      Discussed the risk of surgery including post-op infxn, seroma, biloma, chronic pain, poor-delayed wound healing, retained gallstone, conversion to open procedure,  post-op SBO or ileus, and need for additional procedures to  address said risks.  The risks of general anesthetic including MI, CVA, sudden death or even reaction to anesthetic medications also discussed. Alternatives include continued observation.  Benefits include possible symptom relief, prevention of complications including acute cholecystitis, pancreatitis.  Typical post operative recovery of 3-5 days rest, continued pain in area and incision sites, possible loose stools up to 4-6 weeks, also discussed.  The patient understands the risks, any and all questions were answered to the patient's satisfaction.  Continue present management for pancreatitis.  We will proceed with lap chole during this hospital visit once pancreatitis symptoms resolved.  We will also need possible outpatient work-up for his incidental finding of thickened esophageal wall.

## 2018-06-30 NOTE — ED Notes (Signed)
EDP Sung notified of 8/10 pain.

## 2018-06-30 NOTE — H&P (Signed)
[facility] NAME: John Stanton    MR#:  [id number]  DATE OF BIRTH:  1966-01-05  DATE OF ADMISSION:  06/30/2018  PRIMARY CARE PHYSICIAN: Idelle Crouch, MD   REQUESTING/REFERRING PHYSICIAN: Dr. Beather Arbour  CHIEF COMPLAINT:   Chief Complaint  Patient presents with  . Abdominal Pain    HISTORY OF PRESENT ILLNESS:  John Stanton  is a 53 y.o. male with a known history listed below presented to emergency room for evaluation of right-sided and epigastric abdominal pain associated with nausea and vomiting.  Patient drank some coffee yesterday morning and told his wife that it did not taste right.  Patient started having multiple episodes of nausea and vomiting all day.  No blood in vomit.  No diarrhea.  Patient noticed severe epigastric and right upper quadrant abdominal pain, sharp, constant, intolerable.  Patient also noticed chills without any fever.  Denies chest pain or shortness of breath.  No other complaints.  In emergency room patient has initial evaluation that showed elevated lipase.  Patient underwent CT scan, MRCP and ultrasound.  Patient has acute pancreatitis and gallstones.  No choledocholithiasis on MRCP.  Patient received symptomatic treatment in the emergency room.  Hospitalist team requested for admission.  PAST MEDICAL HISTORY:   Past Medical History:  Diagnosis Date  . Anxiety   . Collagen vascular disease ([place])   . Diabetes mellitus without complication ([place])   . Multiple nasal polyps    required ENT surgery by Dr. Carlis Abbott in (approximately) 2016  . Rheumatoid arthritis ([place])     PAST SURGICAL HISTORY:   Past Surgical History:  Procedure Laterality Date  . FOOT SURGERY    . NASAL POLYP EXCISION    . WRIST SURGERY      SOCIAL HISTORY:   Social History   Tobacco Use  . Smoking status: Never Smoker  . Smokeless tobacco: Never Used  Substance Use Topics  . Alcohol use: No        DRUG ALLERGIES:    Allergies  Allergen Reactions  . Infliximab Other (See Comments)  . Methotrexate Other (See Comments)  . Venlafaxine Other (See Comments)    ELEVATED BP  . Prednisone Rash    REVIEW OF SYSTEMS:   ROS -12 point review of system reviewed.  Positive as per HPI otherwise negative.  MEDICATIONS AT HOME:   Prior to Admission medications   Medication Sig Start Date End Date Taking? Authorizing Provider  ALPRAZolam Duanne Moron) 0.5 MG tablet Take 0.5-1 mg by mouth at bedtime as needed for sleep. for sleep 09/11/16   [provider]  bisoprolol-hydrochlorothiazide (ZIAC) 5-6.25 MG tablet Take 1 tablet by mouth daily. 06/24/18   [provider]  celecoxib (CELEBREX) 200 MG capsule Take 1 capsule by mouth daily. 06/06/18   [provider]  citalopram (CELEXA) 20 MG tablet Take 20 mg by mouth daily. 02/10/16   [provider]  DULoxetine (CYMBALTA) 30 MG capsule Take 60 mg by mouth daily. 04/08/18   [provider]  pravastatin (PRAVACHOL) 40 MG tablet Take 40 mg by mouth daily. 08/06/16   [provider]  tiZANidine (ZANAFLEX) 4 MG tablet Take 1 tablet by mouth at bedtime as needed. 06/17/18   [provider]  Tofacitinib Citrate (XELJANZ) 5 MG TABS Take 5 mg by mouth daily. 07/28/16   [provider]      VITAL SIGNS:  Blood pressure (!) 161/84, pulse 60, temperature (!) 97.4 F (  36.3 C), temperature source Oral, resp. rate 17, height 5\' 10"  (1.778 m), weight 117.9 kg, SpO2 98 %.  PHYSICAL EXAMINATION:  Physical Exam  GENERAL:  53 y.o.-year-old patient lying in the bed with no acute distress.  EYES: Pupils equal, round, reactive to light and accommodation. No scleral icterus. Extraocular muscles intact.  HEENT: Head atraumatic, normocephalic. Oropharynx and nasopharynx clear.  NECK:  Supple, no jugular venous distention. No thyroid enlargement, no tenderness.  LUNGS: Normal breath sounds bilaterally, no wheezing,  rales,rhonchi or crepitation. No use of accessory muscles of respiration.  CARDIOVASCULAR: S1, S2 normal. No murmurs, rubs, or gallops.  ABDOMEN: Soft, epigastric and right upper quadrant tenderness on palpation. nondistended. Bowel sounds present. No organomegaly or mass.  No rigidity or guarding. EXTREMITIES: No pedal edema, cyanosis, or clubbing.  NEUROLOGIC: Cranial nerves II through XII are intact. Muscle strength 5/5 in all extremities. Sensation intact. Gait not checked.  PSYCHIATRIC: The patient is alert and oriented x 3.  SKIN: No obvious rash, lesion, or ulcer.   LABORATORY PANEL:   CBC Recent Labs  Lab 06/30/18 0122  WBC 16.5*  HGB 15.1  HCT 43.6  PLT 301   ------------------------------------------------------------------------------------------------------------------  Chemistries  Recent Labs  Lab 06/30/18 0122  NA 140  K 3.5  CL 105  CO2 22  GLUCOSE 186*  BUN 16  CREATININE 1.00  CALCIUM 9.0  AST 150*  ALT 150*  ALKPHOS 113  BILITOT 3.8*   ------------------------------------------------------------------------------------------------------------------  Cardiac Enzymes No results for input(s): TROPONINI in the last 168 hours. ------------------------------------------------------------------------------------------------------------------  RADIOLOGY:  Ct Abdomen Pelvis W Contrast  Result Date: 06/30/2018 CLINICAL DATA:  Nausea and vomiting with abdominal pain. EXAM: CT ABDOMEN AND PELVIS WITH CONTRAST TECHNIQUE: Multidetector CT imaging of the abdomen and pelvis was performed using the standard protocol following bolus administration of intravenous contrast. CONTRAST:  175mL ISOVUE-370 IOPAMIDOL (ISOVUE-370) INJECTION 76% COMPARISON:  None. FINDINGS: Lower chest: Clear lung bases. Normal heart size without pericardial or pleural effusion. Mild distal esophageal wall thickening, including on image 15/2. Hepatobiliary: Suspect mild hepatic steatosis,  without focal liver lesion. Borderline gallbladder distension with edema in the region of the gallbladder neck, favored to be secondary to the more diffuse process below. The common duct is dilated for age at 11 mm on image 47/5. Possible distal common duct stone of on the order of 8 mm on image 40/2. Pancreas: Peripancreatic edema, primarily about the head and neck. Edema also identified adjacent the descending duodenum. The pancreas enhances normally. No pancreatic duct dilatation. Spleen: Normal in size, without focal abnormality. Adrenals/Urinary Tract: Normal adrenal glands. Lower pole right renal subcentimeter cyst. Too small to characterize left renal lesion, without hydronephrosis. Normal urinary bladder. Stomach/Bowel: Normal stomach, without wall thickening. Tiny periampullary duodenal diverticulum. Scattered colonic diverticula. Normal terminal ileum and appendix. Otherwise normal small bowel. Vascular/Lymphatic: Aortic and branch vessel atherosclerosis. Patent portal and splenic veins. No abdominopelvic adenopathy. Reproductive: Normal prostate. Other: No significant free fluid. Musculoskeletal: Disc bulge at the lumbosacral junction IMPRESSION: 1. Peripancreatic edema, most consistent with acute pancreatitis. Presumably secondary edema adjacent the descending duodenum and gallbladder. Duodenitis and/or cholecystitis cannot be excluded. 2. Common duct dilatation for age with possible distal common duct stone. Correlate with bilirubin levels. If these are elevated, ERCP or MRCP should be considered. 3. Distal esophageal wall thickening, suspicious for esophagitis. 4. Probable mild hepatic steatosis. 5.  Aortic Atherosclerosis (ICD10-I70.0).  This is age advanced. Electronically Signed   By: Abigail Miyamoto M.D.   On: 06/30/2018 02:33  Mr 3d Recon At Scanner  Result Date: 06/30/2018 CLINICAL DATA:  Acute pancreatitis and dilated common bile duct. Abdominal pain. Elevated bilirubin. EXAM: MRI ABDOMEN  WITHOUT AND WITH CONTRAST (INCLUDING MRCP) TECHNIQUE: Multiplanar multisequence MR imaging of the abdomen was performed both before and after the administration of intravenous contrast. Heavily T2-weighted images of the biliary and pancreatic ducts were obtained, and three-dimensional MRCP images were rendered by post processing. CONTRAST:  10 cc Gadavist IV. COMPARISON:  06/30/2018 right upper quadrant abdominal sonogram and CT abdomen/pelvis. FINDINGS: Lower chest: Hypoventilatory changes at the dependent lung bases. Mild circumferential wall thickening in the lower thoracic esophagus. Hepatobiliary: Normal liver size and configuration. Diffuse hepatic steatosis. No liver mass. Mildly distended gallbladder with mild diffuse gallbladder wall thickening. Mild pericholecystic edema. A few subcentimeter layering gallstones, largest 4 mm. No intrahepatic biliary ductal dilatation. Common bile duct diameter 8 mm. Smooth distal tapering of the CBD with no biliary filling defects, strictures or masses. Small periampullary duodenal diverticulum. Pancreas: There is diffuse pancreatic parenchymal and peripancreatic edema compatible with acute pancreatitis. No measurable peripancreatic fluid collections. No pancreatic mass or duct dilation. Preserved pancreatic parenchymal enhancement. No pancreas divisum. Spleen: Normal size. No mass. Adrenals/Urinary Tract: Normal adrenals. No hydronephrosis. Simple 1.3 cm lower right renal cyst. Subcentimeter simple lower left renal cyst. No suspicious renal masses. Stomach/Bowel: Normal non-distended stomach. Visualized small and large bowel is normal caliber, with no bowel wall thickening. Vascular/Lymphatic: Normal caliber abdominal aorta. Patent portal, splenic, hepatic and renal veins. No pathologically enlarged lymph nodes in the abdomen. Other: No abdominal ascites or focal fluid collection. Musculoskeletal: No aggressive appearing focal osseous lesions. IMPRESSION: 1.  Cholelithiasis. Nonspecific mild diffuse gallbladder wall thickening and mild gallbladder distention with mild pericholecystic edema. Acute cholecystitis can not be excluded by MRI, although these findings could be reactive to the acute pancreatitis. 2. Acute non-necrotizing pancreatitis. 3. Mildly dilated common bile duct (8 mm diameter) with no intrahepatic biliary ductal dilatation. No evidence of choledocholithiasis. Small periampullary duodenal diverticulum. 4. Diffuse hepatic steatosis. 5. Nonspecific mild circumferential wall thickening in the lower thoracic esophagus, most commonly due to reflux esophagitis, although Barrett's esophagus or esophageal neoplasm can not be excluded by imaging. Decision to pursue further evaluation should be based on clinical assessment. Electronically Signed   By: Ilona Sorrel M.D.   On: 06/30/2018 06:09   Mr Abdomen Mrcp Moise Boring Contast  Result Date: 06/30/2018 CLINICAL DATA:  Acute pancreatitis and dilated common bile duct. Abdominal pain. Elevated bilirubin. EXAM: MRI ABDOMEN WITHOUT AND WITH CONTRAST (INCLUDING MRCP) TECHNIQUE: Multiplanar multisequence MR imaging of the abdomen was performed both before and after the administration of intravenous contrast. Heavily T2-weighted images of the biliary and pancreatic ducts were obtained, and three-dimensional MRCP images were rendered by post processing. CONTRAST:  10 cc Gadavist IV. COMPARISON:  06/30/2018 right upper quadrant abdominal sonogram and CT abdomen/pelvis. FINDINGS: Lower chest: Hypoventilatory changes at the dependent lung bases. Mild circumferential wall thickening in the lower thoracic esophagus. Hepatobiliary: Normal liver size and configuration. Diffuse hepatic steatosis. No liver mass. Mildly distended gallbladder with mild diffuse gallbladder wall thickening. Mild pericholecystic edema. A few subcentimeter layering gallstones, largest 4 mm. No intrahepatic biliary ductal dilatation. Common bile duct  diameter 8 mm. Smooth distal tapering of the CBD with no biliary filling defects, strictures or masses. Small periampullary duodenal diverticulum. Pancreas: There is diffuse pancreatic parenchymal and peripancreatic edema compatible with acute pancreatitis. No measurable peripancreatic fluid collections. No pancreatic mass or duct dilation. Preserved pancreatic parenchymal  enhancement. No pancreas divisum. Spleen: Normal size. No mass. Adrenals/Urinary Tract: Normal adrenals. No hydronephrosis. Simple 1.3 cm lower right renal cyst. Subcentimeter simple lower left renal cyst. No suspicious renal masses. Stomach/Bowel: Normal non-distended stomach. Visualized small and large bowel is normal caliber, with no bowel wall thickening. Vascular/Lymphatic: Normal caliber abdominal aorta. Patent portal, splenic, hepatic and renal veins. No pathologically enlarged lymph nodes in the abdomen. Other: No abdominal ascites or focal fluid collection. Musculoskeletal: No aggressive appearing focal osseous lesions. IMPRESSION: 1. Cholelithiasis. Nonspecific mild diffuse gallbladder wall thickening and mild gallbladder distention with mild pericholecystic edema. Acute cholecystitis can not be excluded by MRI, although these findings could be reactive to the acute pancreatitis. 2. Acute non-necrotizing pancreatitis. 3. Mildly dilated common bile duct (8 mm diameter) with no intrahepatic biliary ductal dilatation. No evidence of choledocholithiasis. Small periampullary duodenal diverticulum. 4. Diffuse hepatic steatosis. 5. Nonspecific mild circumferential wall thickening in the lower thoracic esophagus, most commonly due to reflux esophagitis, although Barrett's esophagus or esophageal neoplasm can not be excluded by imaging. Decision to pursue further evaluation should be based on clinical assessment. Electronically Signed   By: Ilona Sorrel M.D.   On: 06/30/2018 06:09   US Abdomen Limited Ruq  Result Date: 06/30/2018 CLINICAL  DATA:  53 year old male with abdominal pain, nausea vomiting. Elevated LFTs. EXAM: ULTRASOUND ABDOMEN LIMITED RIGHT UPPER QUADRANT COMPARISON:  CT of the abdomen pelvis dated 06/30/2018 FINDINGS: Evaluation is limited due to patient's body habitus and overlying bowel gas. Gallbladder: No gallstones or wall thickening visualized. No sonographic Murphy sign noted by sonographer. Common bile duct: Evaluation of the CBD is very limited due to overlying bowel gas. The central CBD is suboptimally visualized and not well evaluated. Diameter: 9 mm. Liver: There is diffuse increased liver echogenicity most consistent with fatty infiltration. Superimposed infection or fibrosis is not excluded. Portal vein is patent on color Doppler imaging with normal direction of blood flow towards the liver. IMPRESSION: 1. No gallstones or sonographic findings of acute cholecystitis. 2. Mildly dilated CBD. The central CBD is not visualized and obscured by bowel gas. If there remains concern for central CBD stone further evaluation with MRCP is recommended. 3. Fatty liver. Electronically Signed   By: Anner Crete M.D.   On: 06/30/2018 03:52      IMPRESSION AND PLAN:   1.  Acute pancreatitis: Related to gallstones.  CT scan, MRCP and ultrasound as above.  General surgery consult requested.  Follow-up recommendations.  MRCP without choledocholithiasis.  Keep patient n.p.o.  IV hydration.  Symptomatic treatment as indicated.  2.  Leukocytosis: Likely inflammatory response from pancreatitis.  Does not appear septic or toxic.  Will monitor without antibiotic.  Closely monitor for signs of infection.  3.  Hypertension: Not at goal.  Likely related to pain.  Control pain.  PRN IV hydralazine.  4.  Diabetes: Hold oral hypoglycemic agents.  Monitor CBG.  Sliding scale insulin.  Patient will be n.p.o.  5.  Chronic other medical problems: Monitor.  Continue home medication as ordered.  DVT prophylaxis: Lovenox subcutaneous GI  prophylaxis: Protonix  Estimated length of stay more than 2 midnights  All the records are reviewed and case discussed with ED provider. Management plans discussed with the patient and he is in agreement.  CODE STATUS: Full  TOTAL TIME TAKING CARE OF THIS PATIENT: 35 minutes.    Sedalia Muta M.D on 06/30/2018 at 6:44 AM  Between 7am to 6pm - Pager - [phone]  After 6pm go to  [email or web address] - Proofreader  [facility]  Office  [phone]  CC: Primary care physician; Idelle Crouch, MD

## 2018-06-30 NOTE — ED Notes (Signed)
Pt notified urine sample needed when possible.

## 2018-06-30 NOTE — Consult Note (Signed)
Pharmacy Antibiotic Note  John Stanton. is a 53 y.o. male admitted on 06/30/2018 with an intra-abdominal infection related to likely pancreatitis.  Pharmacy has been consulted for Zosyn dosing.  Plan: Zosyn 3.375g IV q8h (4 hour infusion).  Height: 5\' 10"  (177.8 cm) Weight: 260 lb (117.9 kg) IBW/kg (Calculated) : 73  Temp (24hrs), Avg:97.7 F (36.5 C), Min:97.4 F (36.3 C), Max:97.9 F (36.6 C)  Recent Labs  Lab 06/30/18 0122  WBC 16.5*  CREATININE 1.00    Estimated Creatinine Clearance: 111.2 mL/min (by C-G formula based on SCr of 1 mg/dL).    Antimicrobials this admission: Zosyn 1/19 >>   Microbiology results: No pending or completed labs at this time  Thank you for allowing pharmacy to be a part of this patient's care.  Dallie Piles, PharmD 06/30/2018 10:38 AM

## 2018-06-30 NOTE — ED Notes (Signed)
Pt to MRI with EDT Myra.

## 2018-06-30 NOTE — ED Notes (Signed)
Patient transported to CT 

## 2018-07-01 LAB — GLUCOSE, CAPILLARY
GLUCOSE-CAPILLARY: 123 mg/dL — AB (ref 70–99)
Glucose-Capillary: 103 mg/dL — ABNORMAL HIGH (ref 70–99)
Glucose-Capillary: 107 mg/dL — ABNORMAL HIGH (ref 70–99)
Glucose-Capillary: 111 mg/dL — ABNORMAL HIGH (ref 70–99)
Glucose-Capillary: 117 mg/dL — ABNORMAL HIGH (ref 70–99)
Glucose-Capillary: 89 mg/dL (ref 70–99)

## 2018-07-01 LAB — COMPREHENSIVE METABOLIC PANEL
ALT: 214 U/L — ABNORMAL HIGH (ref 0–44)
AST: 152 U/L — ABNORMAL HIGH (ref 15–41)
Albumin: 3.6 g/dL (ref 3.5–5.0)
Alkaline Phosphatase: 140 U/L — ABNORMAL HIGH (ref 38–126)
Anion gap: 6 (ref 5–15)
BUN: 19 mg/dL (ref 6–20)
CO2: 26 mmol/L (ref 22–32)
Calcium: 8 mg/dL — ABNORMAL LOW (ref 8.9–10.3)
Chloride: 106 mmol/L (ref 98–111)
Creatinine, Ser: 0.98 mg/dL (ref 0.61–1.24)
GFR calc Af Amer: >60 mL/min (ref >60)
GFR calc non Af Amer: >60 mL/min (ref >60)
Glucose, Bld: 135 mg/dL — ABNORMAL HIGH (ref 70–99)
Potassium: 3.2 mmol/L — ABNORMAL LOW (ref 3.5–5.1)
Sodium: 138 mmol/L (ref 135–145)
Total Bilirubin: 3 mg/dL — ABNORMAL HIGH (ref 0.3–1.2)
Total Protein: 6.3 g/dL — ABNORMAL LOW (ref 6.5–8.1)

## 2018-07-01 LAB — CBC
HCT: 38.1 % — ABNORMAL LOW (ref 39.0–52.0)
Hemoglobin: 12.8 g/dL — ABNORMAL LOW (ref 13.0–17.0)
MCH: 28.3 pg (ref 26.0–34.0)
MCHC: 33.6 g/dL (ref 30.0–36.0)
MCV: 84.1 fL (ref 80.0–100.0)
Platelets: 215 10*3/uL (ref 150–400)
RBC: 4.53 MIL/uL (ref 4.22–5.81)
RDW: 14.5 % (ref 11.5–15.5)
WBC: 9.3 10*3/uL (ref 4.0–10.5)
nRBC: 0 % (ref 0.0–0.2)

## 2018-07-01 LAB — LIPASE, BLOOD: Lipase: 110 U/L — ABNORMAL HIGH (ref 11–51)

## 2018-07-01 LAB — PROTIME-INR
INR: 1.42
Prothrombin Time: 17.2 seconds — ABNORMAL HIGH (ref 11.4–15.2)

## 2018-07-01 LAB — APTT: aPTT: 38 seconds — ABNORMAL HIGH (ref 24–36)

## 2018-07-01 MED ORDER — SODIUM CHLORIDE 0.9 % IV SOLN
INTRAVENOUS | Status: DC | PRN
  Administered 2018-07-01 – 2018-07-03 (×2): 250 mL via INTRAVENOUS

## 2018-07-01 MED ORDER — TOFACITINIB CITRATE 5 MG PO TABS: 5.0000 mg | ORAL_TABLET | Freq: Every day | ORAL | Status: DC

## 2018-07-01 MED ORDER — ALPRAZOLAM 0.5 MG PO TABS: 0.5000 mg | ORAL_TABLET | Freq: Every evening | ORAL | Status: DC | PRN

## 2018-07-01 MED ORDER — DULOXETINE HCL 30 MG PO CPEP
60.0000 mg | ORAL_CAPSULE | Freq: Every day | ORAL | Status: DC
  Administered 2018-07-01 – 2018-07-05 (×4): 60 mg via ORAL
  Filled 2018-07-01 (×4): qty 2

## 2018-07-01 MED ORDER — POTASSIUM CHLORIDE CRYS ER 20 MEQ PO TBCR
40.0000 meq | EXTENDED_RELEASE_TABLET | ORAL | Status: AC
  Administered 2018-07-01 (×2): 40 meq via ORAL
  Filled 2018-07-01 (×2): qty 2

## 2018-07-01 MED ORDER — TOFACITINIB CITRATE 5 MG PO TABS
5.0000 mg | ORAL_TABLET | Freq: Two times a day (BID) | ORAL | Status: DC
  Administered 2018-07-01 – 2018-07-05 (×5): 5 mg via ORAL
  Filled 2018-07-01 (×6): qty 1

## 2018-07-01 MED ORDER — CITALOPRAM HYDROBROMIDE 20 MG PO TABS
20.0000 mg | ORAL_TABLET | Freq: Every day | ORAL | Status: DC
  Administered 2018-07-01 – 2018-07-05 (×4): 20 mg via ORAL
  Filled 2018-07-01 (×5): qty 1

## 2018-07-01 NOTE — Progress Notes (Signed)
[facility] [place] NAME: John Stanton    MR#:  [id number]  DATE OF BIRTH:  1966-04-13  SUBJECTIVE: Less abdominal pain, lipase level trending down, no nausea or vomiting.  CHIEF COMPLAINT:   Chief Complaint  Patient presents with  . Abdominal Pain  Emitted for acute gallstone pancreatitis.  REVIEW OF SYSTEMS:   ROS CONSTITUTIONAL: No fever, fatigue or weakness.  EYES: No blurred or double vision.  EARS, NOSE, AND THROAT: No tinnitus or ear pain.  RESPIRATORY: No cough, shortness of breath, wheezing or hemoptysis.  CARDIOVASCULAR: No chest pain, orthopnea, edema.  GASTROINTESTINAL: No nausea, vomiting, diarrhea or abdominal pain.  GENITOURINARY: No dysuria, hematuria.  ENDOCRINE: No polyuria, nocturia,  HEMATOLOGY: No anemia, easy bruising or bleeding SKIN: No rash or lesion. MUSCULOSKELETAL: No joint pain or arthritis.   NEUROLOGIC: No tingling, numbness, weakness.  PSYCHIATRY: No anxiety or depression.   DRUG ALLERGIES:   Allergies  Allergen Reactions  . Infliximab Other (See Comments)  . Methotrexate Other (See Comments)  . Venlafaxine Other (See Comments)    ELEVATED BP  . Prednisone Rash    VITALS:  Blood pressure 135/76, pulse 87, temperature 99.9 F (37.7 C), temperature source Oral, resp. rate 20, height 5\' 10"  (1.778 m), weight 117.9 kg, SpO2 98 %.  PHYSICAL EXAMINATION:  GENERAL:  53 y.o.-year-old patient lying in the bed with no acute distress.  EYES: Pupils equal, round, reactive to light and accommodation. No scleral icterus. Extraocular muscles intact.  HEENT: Head atraumatic, normocephalic. Oropharynx and nasopharynx clear.  NECK:  Supple, no jugular venous distention. No thyroid enlargement, no tenderness.  LUNGS: Normal breath sounds bilaterally, no wheezing, rales,rhonchi or crepitation. No use of accessory muscles of respiration.  CARDIOVASCULAR: S1, S2 normal. No murmurs, rubs, or gallops.   ABDOMEN: Soft, nontender, nondistended. Bowel sounds present. No organomegaly or mass.  EXTREMITIES: No pedal edema, cyanosis, or clubbing.  NEUROLOGIC: Cranial nerves II through XII are intact. Muscle strength 5/5 in all extremities. Sensation intact. Gait not checked.  PSYCHIATRIC: The patient is alert and oriented x 3.  SKIN: No obvious rash, lesion, or ulcer.    LABORATORY PANEL:   CBC Recent Labs  Lab 07/01/18 0520  WBC 9.3  HGB 12.8*  HCT 38.1*  PLT 215   ------------------------------------------------------------------------------------------------------------------  Chemistries  Recent Labs  Lab 07/01/18 0520  NA 138  K 3.2*  CL 106  CO2 26  GLUCOSE 135*  BUN 19  CREATININE 0.98  CALCIUM 8.0*  AST 152*  ALT 214*  ALKPHOS 140*  BILITOT 3.0*   ------------------------------------------------------------------------------------------------------------------  Cardiac Enzymes No results for input(s): TROPONINI in the last 168 hours. ------------------------------------------------------------------------------------------------------------------  RADIOLOGY:  Ct Abdomen Pelvis W Contrast  Result Date: 06/30/2018 CLINICAL DATA:  Nausea and vomiting with abdominal pain. EXAM: CT ABDOMEN AND PELVIS WITH CONTRAST TECHNIQUE: Multidetector CT imaging of the abdomen and pelvis was performed using the standard protocol following bolus administration of intravenous contrast. CONTRAST:  127mL ISOVUE-370 IOPAMIDOL (ISOVUE-370) INJECTION 76% COMPARISON:  None. FINDINGS: Lower chest: Clear lung bases. Normal heart size without pericardial or pleural effusion. Mild distal esophageal wall thickening, including on image 15/2. Hepatobiliary: Suspect mild hepatic steatosis, without focal liver lesion. Borderline gallbladder distension with edema in the region of the gallbladder neck, favored to be secondary to the more diffuse process below. The common duct is dilated for age at 11  mm on image 47/5. Possible distal common duct stone of on the order of 8  mm on image 40/2. Pancreas: Peripancreatic edema, primarily about the head and neck. Edema also identified adjacent the descending duodenum. The pancreas enhances normally. No pancreatic duct dilatation. Spleen: Normal in size, without focal abnormality. Adrenals/Urinary Tract: Normal adrenal glands. Lower pole right renal subcentimeter cyst. Too small to characterize left renal lesion, without hydronephrosis. Normal urinary bladder. Stomach/Bowel: Normal stomach, without wall thickening. Tiny periampullary duodenal diverticulum. Scattered colonic diverticula. Normal terminal ileum and appendix. Otherwise normal small bowel. Vascular/Lymphatic: Aortic and branch vessel atherosclerosis. Patent portal and splenic veins. No abdominopelvic adenopathy. Reproductive: Normal prostate. Other: No significant free fluid. Musculoskeletal: Disc bulge at the lumbosacral junction IMPRESSION: 1. Peripancreatic edema, most consistent with acute pancreatitis. Presumably secondary edema adjacent the descending duodenum and gallbladder. Duodenitis and/or cholecystitis cannot be excluded. 2. Common duct dilatation for age with possible distal common duct stone. Correlate with bilirubin levels. If these are elevated, ERCP or MRCP should be considered. 3. Distal esophageal wall thickening, suspicious for esophagitis. 4. Probable mild hepatic steatosis. 5.  Aortic Atherosclerosis (ICD10-I70.0).  This is age advanced. Electronically Signed   By: Abigail Miyamoto M.D.   On: 06/30/2018 02:33   Mr 3d Recon At Scanner  Result Date: 06/30/2018 CLINICAL DATA:  Acute pancreatitis and dilated common bile duct. Abdominal pain. Elevated bilirubin. EXAM: MRI ABDOMEN WITHOUT AND WITH CONTRAST (INCLUDING MRCP) TECHNIQUE: Multiplanar multisequence MR imaging of the abdomen was performed both before and after the administration of intravenous contrast. Heavily T2-weighted images of  the biliary and pancreatic ducts were obtained, and three-dimensional MRCP images were rendered by post processing. CONTRAST:  10 cc Gadavist IV. COMPARISON:  06/30/2018 right upper quadrant abdominal sonogram and CT abdomen/pelvis. FINDINGS: Lower chest: Hypoventilatory changes at the dependent lung bases. Mild circumferential wall thickening in the lower thoracic esophagus. Hepatobiliary: Normal liver size and configuration. Diffuse hepatic steatosis. No liver mass. Mildly distended gallbladder with mild diffuse gallbladder wall thickening. Mild pericholecystic edema. A few subcentimeter layering gallstones, largest 4 mm. No intrahepatic biliary ductal dilatation. Common bile duct diameter 8 mm. Smooth distal tapering of the CBD with no biliary filling defects, strictures or masses. Small periampullary duodenal diverticulum. Pancreas: There is diffuse pancreatic parenchymal and peripancreatic edema compatible with acute pancreatitis. No measurable peripancreatic fluid collections. No pancreatic mass or duct dilation. Preserved pancreatic parenchymal enhancement. No pancreas divisum. Spleen: Normal size. No mass. Adrenals/Urinary Tract: Normal adrenals. No hydronephrosis. Simple 1.3 cm lower right renal cyst. Subcentimeter simple lower left renal cyst. No suspicious renal masses. Stomach/Bowel: Normal non-distended stomach. Visualized small and large bowel is normal caliber, with no bowel wall thickening. Vascular/Lymphatic: Normal caliber abdominal aorta. Patent portal, splenic, hepatic and renal veins. No pathologically enlarged lymph nodes in the abdomen. Other: No abdominal ascites or focal fluid collection. Musculoskeletal: No aggressive appearing focal osseous lesions. IMPRESSION: 1. Cholelithiasis. Nonspecific mild diffuse gallbladder wall thickening and mild gallbladder distention with mild pericholecystic edema. Acute cholecystitis can not be excluded by MRI, although these findings could be reactive to  the acute pancreatitis. 2. Acute non-necrotizing pancreatitis. 3. Mildly dilated common bile duct (8 mm diameter) with no intrahepatic biliary ductal dilatation. No evidence of choledocholithiasis. Small periampullary duodenal diverticulum. 4. Diffuse hepatic steatosis. 5. Nonspecific mild circumferential wall thickening in the lower thoracic esophagus, most commonly due to reflux esophagitis, although Barrett's esophagus or esophageal neoplasm can not be excluded by imaging. Decision to pursue further evaluation should be based on clinical assessment. Electronically Signed   By: Ilona Sorrel M.D.   On: 06/30/2018 06:09  Mr Abdomen Mrcp Moise Boring Contast  Result Date: 06/30/2018 CLINICAL DATA:  Acute pancreatitis and dilated common bile duct. Abdominal pain. Elevated bilirubin. EXAM: MRI ABDOMEN WITHOUT AND WITH CONTRAST (INCLUDING MRCP) TECHNIQUE: Multiplanar multisequence MR imaging of the abdomen was performed both before and after the administration of intravenous contrast. Heavily T2-weighted images of the biliary and pancreatic ducts were obtained, and three-dimensional MRCP images were rendered by post processing. CONTRAST:  10 cc Gadavist IV. COMPARISON:  06/30/2018 right upper quadrant abdominal sonogram and CT abdomen/pelvis. FINDINGS: Lower chest: Hypoventilatory changes at the dependent lung bases. Mild circumferential wall thickening in the lower thoracic esophagus. Hepatobiliary: Normal liver size and configuration. Diffuse hepatic steatosis. No liver mass. Mildly distended gallbladder with mild diffuse gallbladder wall thickening. Mild pericholecystic edema. A few subcentimeter layering gallstones, largest 4 mm. No intrahepatic biliary ductal dilatation. Common bile duct diameter 8 mm. Smooth distal tapering of the CBD with no biliary filling defects, strictures or masses. Small periampullary duodenal diverticulum. Pancreas: There is diffuse pancreatic parenchymal and peripancreatic edema compatible  with acute pancreatitis. No measurable peripancreatic fluid collections. No pancreatic mass or duct dilation. Preserved pancreatic parenchymal enhancement. No pancreas divisum. Spleen: Normal size. No mass. Adrenals/Urinary Tract: Normal adrenals. No hydronephrosis. Simple 1.3 cm lower right renal cyst. Subcentimeter simple lower left renal cyst. No suspicious renal masses. Stomach/Bowel: Normal non-distended stomach. Visualized small and large bowel is normal caliber, with no bowel wall thickening. Vascular/Lymphatic: Normal caliber abdominal aorta. Patent portal, splenic, hepatic and renal veins. No pathologically enlarged lymph nodes in the abdomen. Other: No abdominal ascites or focal fluid collection. Musculoskeletal: No aggressive appearing focal osseous lesions. IMPRESSION: 1. Cholelithiasis. Nonspecific mild diffuse gallbladder wall thickening and mild gallbladder distention with mild pericholecystic edema. Acute cholecystitis can not be excluded by MRI, although these findings could be reactive to the acute pancreatitis. 2. Acute non-necrotizing pancreatitis. 3. Mildly dilated common bile duct (8 mm diameter) with no intrahepatic biliary ductal dilatation. No evidence of choledocholithiasis. Small periampullary duodenal diverticulum. 4. Diffuse hepatic steatosis. 5. Nonspecific mild circumferential wall thickening in the lower thoracic esophagus, most commonly due to reflux esophagitis, although Barrett's esophagus or esophageal neoplasm can not be excluded by imaging. Decision to pursue further evaluation should be based on clinical assessment. Electronically Signed   By: Ilona Sorrel M.D.   On: 06/30/2018 06:09   US Abdomen Limited Ruq  Result Date: 06/30/2018 CLINICAL DATA:  53 year old male with abdominal pain, nausea vomiting. Elevated LFTs. EXAM: ULTRASOUND ABDOMEN LIMITED RIGHT UPPER QUADRANT COMPARISON:  CT of the abdomen pelvis dated 06/30/2018 FINDINGS: Evaluation is limited due to patient's  body habitus and overlying bowel gas. Gallbladder: No gallstones or wall thickening visualized. No sonographic Murphy sign noted by sonographer. Common bile duct: Evaluation of the CBD is very limited due to overlying bowel gas. The central CBD is suboptimally visualized and not well evaluated. Diameter: 9 mm. Liver: There is diffuse increased liver echogenicity most consistent with fatty infiltration. Superimposed infection or fibrosis is not excluded. Portal vein is patent on color Doppler imaging with normal direction of blood flow towards the liver. IMPRESSION: 1. No gallstones or sonographic findings of acute cholecystitis. 2. Mildly dilated CBD. The central CBD is not visualized and obscured by bowel gas. If there remains concern for central CBD stone further evaluation with MRCP is recommended. 3. Fatty liver. Electronically Signed   By: Anner Crete M.D.   On: 06/30/2018 03:52    EKG:   Orders placed or performed during the hospital  encounter of 10/02/16  . ED EKG  . ED EKG  . EKG 12-Lead  . EKG 12-Lead    ASSESSMENT AND PLAN:   53 year old male patient with gallstone pancreatitis: Lipase level trending down,, denies abdominal pain, nausea today, start clear liquids, continue IV pain meds, IV nausea meds, patient is on empiric antibiotics.  Decrease IV fluids today. 2.  For gallstone pancreatitis seen by surgeon, patient can have lap chole Possible lap chole on this admission.  3 ,hypokalemia, replace potassium.  All the records are reviewed and case discussed with Care Management/Social Workerr. Management plans discussed with the patient, family and they are in agreement.  CODE STATUS: Full code  TOTAL TIME TAKING CARE OF THIS PATIENT: 35 minutes.   POSSIBLE D/C IN 3-4DAYS, DEPENDING ON CLINICAL CONDITION.   [name] M.D on 07/01/2018 at 11:13 AM  Between 7am to 6pm - Pager - [phone]  After 6pm go to [email or web address] - password [email or web address]  Office  [phone]  CC: Primary care physician; Idelle Crouch, MD   Note: This dictation was prepared with Dragon dictation along with smaller phrase technology. Any transcriptional errors that result from this process are unintentional.

## 2018-07-01 NOTE — Progress Notes (Signed)
Order received from Dr Vianne Bulls for a clear liquid diet

## 2018-07-01 NOTE — Progress Notes (Signed)
Subjective:  CC:  John Stanton. is a 53 y.o. male  Hospital stay day 1,   gallstone pancreatitis  HPI: No issue overnight.  Feeling better  ROS:  General: Denies weight loss, weight gain, fatigue, fevers, chills, and night sweats. Heart: Denies chest pain, palpitations, racing heart, irregular heartbeat, leg pain or swelling, and decreased activity tolerance. Respiratory: Denies breathing difficulty, shortness of breath, wheezing, cough, and sputum. GI: Denies change in appetite, heartburn, nausea, vomiting, constipation, diarrhea, and blood in stool. GU: Denies difficulty urinating, pain with urinating, urgency, frequency, blood in urine.   Objective:      Temp:  [99.3 F (37.4 C)-99.9 F (37.7 C)] 99.9 F (37.7 C) (01/20 0428) Pulse Rate:  [87-96] 87 (01/20 0428) Resp:  [20] 20 (01/20 0428) BP: (110-135)/(62-76) 135/76 (01/20 0428) SpO2:  [97 %-98 %] 98 % (01/20 0428)     Height: 5\' 10"  (177.8 cm) Weight: 117.9 kg BMI (Calculated): 37.31   Intake/Output this shift:   Intake/Output Summary (Last 24 hours) at 07/01/2018 1157 Last data filed at 07/01/2018 1100 Gross per 24 hour  Intake 2888.67 ml  Output 500 ml  Net 2388.67 ml        Constitutional :  alert, cooperative, appears stated age and no distress  Respiratory:  clear to auscultation bilaterally  Cardiovascular:  regular rate and rhythm  Gastrointestinal: soft, no guarding, improved tenderness in epigastric, RUQ region but still present in far lateral RUQ.   Skin: Cool and moist.   Psychiatric: Normal affect, non-agitated, not confused       LABS:  CMP Latest Ref Rng & Units 07/01/2018 06/30/2018 10/02/2016  Glucose 70 - 99 mg/dL 135(H) 186(H) 108(H)  BUN 6 - 20 mg/dL 19 16 15   Creatinine 0.61 - 1.24 mg/dL 0.98 1.00 1.05  Sodium 135 - 145 mmol/L 138 140 137  Potassium 3.5 - 5.1 mmol/L 3.2(L) 3.5 2.9(L)  Chloride 98 - 111 mmol/L 106 105 101  CO2 22 - 32 mmol/L 26 22 26   Calcium 8.9 - 10.3 mg/dL 8.0(L)  9.0 9.0  Total Protein 6.5 - 8.1 g/dL 6.3(L) 7.5 7.8  Total Bilirubin 0.3 - 1.2 mg/dL 3.0(H) 3.8(H) 1.6(H)  Alkaline Phos 38 - 126 U/L 140(H) 113 66  AST 15 - 41 U/L 152(H) 150(H) 38  ALT 0 - 44 U/L 214(H) 150(H) 40   CBC Latest Ref Rng & Units 07/01/2018 06/30/2018 10/02/2016  WBC 4.0 - 10.5 K/uL 9.3 16.5(H) 4.6  Hemoglobin 13.0 - 17.0 g/dL 12.8(L) 15.1 15.6  Hematocrit 39.0 - 52.0 % 38.1(L) 43.6 45.0  Platelets 150 - 400 K/uL 215 301 141(L)    RADS: n/a Assessment:  Gallstone pancreatitis.  Looking better and labs improved.  Scheduled for lap chole on Thursday due to OR and surgeon availability.  Continue present management until then

## 2018-07-02 LAB — GLUCOSE, CAPILLARY
GLUCOSE-CAPILLARY: 105 mg/dL — AB (ref 70–99)
Glucose-Capillary: 109 mg/dL — ABNORMAL HIGH (ref 70–99)
Glucose-Capillary: 105 mg/dL — ABNORMAL HIGH (ref 70–99)
Glucose-Capillary: 121 mg/dL — ABNORMAL HIGH (ref 70–99)
Glucose-Capillary: 101 mg/dL — ABNORMAL HIGH (ref 70–99)

## 2018-07-02 LAB — HIV ANTIBODY (ROUTINE TESTING W REFLEX): HIV Screen 4th Generation wRfx: NONREACTIVE

## 2018-07-02 MED ORDER — SODIUM CHLORIDE 0.9 % IV SOLN
3.0000 g | Freq: Three times a day (TID) | INTRAVENOUS | Status: AC
  Administered 2018-07-02 – 2018-07-04 (×6): 3 g via INTRAVENOUS
  Filled 2018-07-02 (×6): qty 3

## 2018-07-02 NOTE — Progress Notes (Signed)
Subjective:  CC:  John Stanton Drayke Grabel. is a 53 y.o. male  Hospital stay day 2,   gallstone pancreatitis  HPI: No issue overnight.  Continues to feel better  ROS:  General: Denies weight loss, weight gain, fatigue, fevers, chills, and night sweats. Heart: Denies chest pain, palpitations, racing heart, irregular heartbeat, leg pain or swelling, and decreased activity tolerance. Respiratory: Denies breathing difficulty, shortness of breath, wheezing, cough, and sputum. GI: Denies change in appetite, heartburn, nausea, vomiting, constipation, diarrhea, and blood in stool. GU: Denies difficulty urinating, pain with urinating, urgency, frequency, blood in urine.   Objective:      Temp:  [98.4 F (36.9 C)-99 F (37.2 C)] 99 F (37.2 C) (01/21 1143) Pulse Rate:  [66-75] 70 (01/21 1143) Resp:  [18-20] 18 (01/21 1143) BP: (136-158)/(79-87) 136/81 (01/21 1143) SpO2:  [96 %-99 %] 99 % (01/21 1143)     Height: 5\' 10"  (177.8 cm) Weight: 117.9 kg BMI (Calculated): 37.31   Intake/Output this shift:   Intake/Output Summary (Last 24 hours) at 07/02/2018 1401 Last data filed at 07/02/2018 1207 Gross per 24 hour  Intake 2088.34 ml  Output 2875 ml  Net -786.66 ml        Constitutional :  alert, cooperative, appears stated age and no distress  Respiratory:  clear to auscultation bilaterally  Cardiovascular:  regular rate and rhythm  Gastrointestinal: soft, no guarding, tenderness resolved in epigastric, RUQ region but minimal in far lateral RUQ.   Skin: Cool and moist.   Psychiatric: Normal affect, non-agitated, not confused       LABS:  CMP Latest Ref Rng & Units 07/01/2018 06/30/2018 10/02/2016  Glucose 70 - 99 mg/dL 135(H) 186(H) 108(H)  BUN 6 - 20 mg/dL 19 16 15   Creatinine 0.61 - 1.24 mg/dL 0.98 1.00 1.05  Sodium 135 - 145 mmol/L 138 140 137  Potassium 3.5 - 5.1 mmol/L 3.2(L) 3.5 2.9(L)  Chloride 98 - 111 mmol/L 106 105 101  CO2 22 - 32 mmol/L 26 22 26   Calcium 8.9 - 10.3 mg/dL  8.0(L) 9.0 9.0  Total Protein 6.5 - 8.1 g/dL 6.3(L) 7.5 7.8  Total Bilirubin 0.3 - 1.2 mg/dL 3.0(H) 3.8(H) 1.6(H)  Alkaline Phos 38 - 126 U/L 140(H) 113 66  AST 15 - 41 U/L 152(H) 150(H) 38  ALT 0 - 44 U/L 214(H) 150(H) 40   CBC Latest Ref Rng & Units 07/01/2018 06/30/2018 10/02/2016  WBC 4.0 - 10.5 K/uL 9.3 16.5(H) 4.6  Hemoglobin 13.0 - 17.0 g/dL 12.8(L) 15.1 15.6  Hematocrit 39.0 - 52.0 % 38.1(L) 43.6 45.0  Platelets 150 - 400 K/uL 215 301 141(L)    RADS: n/a Assessment:  Gallstone pancreatitis.  Continuing to improve.  Scheduled for lap chole on Thursday due to OR and surgeon availability.  Continue present management until then

## 2018-07-02 NOTE — Progress Notes (Signed)
[facility] [place] NAME: Wrangler Penning    MR#:  [id number]  DATE OF BIRTH:  [birth year]  SUBJECTIVE: Further abdominal pain, tolerating clear liquid diet.  No nausea.  Possible lap chole on Thursday.  Will advance diet to soft diet today.  CHIEF COMPLAINT:   Chief Complaint  Patient presents with  . Abdominal Pain  Emitted for acute gallstone pancreatitis.  REVIEW OF SYSTEMS:   ROS CONSTITUTIONAL: No fever, fatigue or weakness.  EYES: No blurred or double vision.  EARS, NOSE, AND THROAT: No tinnitus or ear pain.  RESPIRATORY: No cough, shortness of breath, wheezing or hemoptysis.  CARDIOVASCULAR: No chest pain, orthopnea, edema.  GASTROINTESTINAL: No nausea, vomiting, diarrhea or abdominal pain.  GENITOURINARY: No dysuria, hematuria.  ENDOCRINE: No polyuria, nocturia,  HEMATOLOGY: No anemia, easy bruising or bleeding SKIN: No rash or lesion. MUSCULOSKELETAL: No joint pain or arthritis.   NEUROLOGIC: No tingling, numbness, weakness.  PSYCHIATRY: No anxiety or depression.   DRUG ALLERGIES:   Allergies  Allergen Reactions  . Infliximab Other (See Comments)  . Methotrexate Other (See Comments)  . Venlafaxine Other (See Comments)    ELEVATED BP  . Prednisone Rash    VITALS:  Blood pressure 136/81, pulse 70, temperature 99 F (37.2 C), temperature source Oral, resp. rate 20, height 5\' 10"  (1.778 m), weight 117.9 kg, SpO2 99 %.  PHYSICAL EXAMINATION:  GENERAL:  53 y.o.-year-old patient lying in the bed with no acute distress.  EYES: Pupils equal, round, reactive to light and accommodation. No scleral icterus. Extraocular muscles intact.  HEENT: Head atraumatic, normocephalic. Oropharynx and nasopharynx clear.  NECK:  Supple, no jugular venous distention. No thyroid enlargement, no tenderness.  LUNGS: Normal breath sounds bilaterally, no wheezing, rales,rhonchi or crepitation. No use of accessory muscles of respiration.   CARDIOVASCULAR: S1, S2 normal. No murmurs, rubs, or gallops.  ABDOMEN: Soft, nontender, nondistended. Bowel sounds present. No organomegaly or mass.  EXTREMITIES: No pedal edema, cyanosis, or clubbing.  NEUROLOGIC: Cranial nerves II through XII are intact. Muscle strength 5/5 in all extremities. Sensation intact. Gait not checked.  PSYCHIATRIC: The patient is alert and oriented x 3.  SKIN: No obvious rash, lesion, or ulcer.    LABORATORY PANEL:   CBC Recent Labs  Lab 07/01/18 0520  WBC 9.3  HGB 12.8*  HCT 38.1*  PLT 215   ------------------------------------------------------------------------------------------------------------------  Chemistries  Recent Labs  Lab 07/01/18 0520  NA 138  K 3.2*  CL 106  CO2 26  GLUCOSE 135*  BUN 19  CREATININE 0.98  CALCIUM 8.0*  AST 152*  ALT 214*  ALKPHOS 140*  BILITOT 3.0*   ------------------------------------------------------------------------------------------------------------------  Cardiac Enzymes No results for input(s): TROPONINI in the last 168 hours. ------------------------------------------------------------------------------------------------------------------  RADIOLOGY:  No results found.  EKG:   Orders placed or performed during the hospital encounter of 10/02/16  . ED EKG  . ED EKG  . EKG 12-Lead  . EKG 12-Lead    ASSESSMENT AND PLAN:   53 year old male patient with gallstone pancreatitis: Lipase level trending down,, denies abdominal pain, nausea, advance diet to soft diet today, see how he does.  2.  For gallstone pancreatitis seen by surgeon, possible lap chole on Thursday.  Continue empiric antibiotics till then, change Zosyn to Unasyn, spoke to pharmacist. 3 ,hypokalemia, replace potassium.  All the records are reviewed and case discussed with Care Management/Social Workerr. Management plans discussed with the patient, family and they are in agreement.  CODE  STATUS: Full code  TOTAL TIME  TAKING CARE OF THIS PATIENT: 35 minutes.   POSSIBLE D/C IN 3-4DAYS, DEPENDING ON CLINICAL CONDITION.   [name] M.D on 07/02/2018 at 11:54 AM  Between 7am to 6pm - Pager - [phone]  After 6pm go to [email or web address] - password [email or web address] [facility]  Office  [phone]  CC: Primary care physician; Idelle Crouch, MD   Note: This dictation was prepared with Dragon dictation along with smaller phrase technology. Any transcriptional errors that result from this process are unintentional.

## 2018-07-03 LAB — GLUCOSE, CAPILLARY
GLUCOSE-CAPILLARY: 90 mg/dL (ref 70–99)
Glucose-Capillary: 119 mg/dL — ABNORMAL HIGH (ref 70–99)
Glucose-Capillary: 123 mg/dL — ABNORMAL HIGH (ref 70–99)
Glucose-Capillary: 111 mg/dL — ABNORMAL HIGH (ref 70–99)

## 2018-07-03 NOTE — Progress Notes (Signed)
[facility] [place] NAME: John Stanton    MR#:  [id number]  DATE OF BIRTH:  Jul 05, 1965  SUBJECTIVE d no further abdominal pain, tolerating soft diet.  CHIEF COMPLAINT:   Chief Complaint  Patient presents with  . Abdominal Pain  . Patient is here for gallstone pancreatitis, for lap chole tomorrow. REVIEW OF SYSTEMS:   ROS CONSTITUTIONAL: No fever, fatigue or weakness.  EYES: No blurred or double vision.  EARS, NOSE, AND THROAT: No tinnitus or ear pain.  RESPIRATORY: No cough, shortness of breath, wheezing or hemoptysis.  CARDIOVASCULAR: No chest pain, orthopnea, edema.  GASTROINTESTINAL: No nausea, vomiting, diarrhea or abdominal pain.  GENITOURINARY: No dysuria, hematuria.  ENDOCRINE: No polyuria, nocturia,  HEMATOLOGY: No anemia, easy bruising or bleeding SKIN: No rash or lesion. MUSCULOSKELETAL: No joint pain or arthritis.   NEUROLOGIC: No tingling, numbness, weakness.  PSYCHIATRY: No anxiety or depression.   DRUG ALLERGIES:   Allergies  Allergen Reactions  . Infliximab Other (See Comments)  . Methotrexate Other (See Comments)  . Venlafaxine Other (See Comments)    ELEVATED BP  . Prednisone Rash    VITALS:  Blood pressure (!) 149/80, pulse 78, temperature (!) 97.5 F (36.4 C), temperature source Oral, resp. rate 20, height 5\' 10"  (1.778 m), weight 117.9 kg, SpO2 99 %.  PHYSICAL EXAMINATION:  GENERAL:  53 y.o.-year-old patient lying in the bed with no acute distress.  EYES: Pupils equal, round, reactive to light and accommodation. No scleral icterus. Extraocular muscles intact.  HEENT: Head atraumatic, normocephalic. Oropharynx and nasopharynx clear.  NECK:  Supple, no jugular venous distention. No thyroid enlargement, no tenderness.  LUNGS: Normal breath sounds bilaterally, no wheezing, rales,rhonchi or crepitation. No use of accessory muscles of respiration.  CARDIOVASCULAR: S1, S2 normal. No murmurs, rubs, or  gallops.  ABDOMEN: Soft, nontender, nondistended. Bowel sounds present. No organomegaly or mass.  EXTREMITIES: No pedal edema, cyanosis, or clubbing.  NEUROLOGIC: Cranial nerves II through XII are intact. Muscle strength 5/5 in all extremities. Sensation intact. Gait not checked.  PSYCHIATRIC: The patient is alert and oriented x 3.  SKIN: No obvious rash, lesion, or ulcer.    LABORATORY PANEL:   CBC Recent Labs  Lab 07/01/18 0520  WBC 9.3  HGB 12.8*  HCT 38.1*  PLT 215   ------------------------------------------------------------------------------------------------------------------  Chemistries  Recent Labs  Lab 07/01/18 0520  NA 138  K 3.2*  CL 106  CO2 26  GLUCOSE 135*  BUN 19  CREATININE 0.98  CALCIUM 8.0*  AST 152*  ALT 214*  ALKPHOS 140*  BILITOT 3.0*   ------------------------------------------------------------------------------------------------------------------  Cardiac Enzymes No results for input(s): TROPONINI in the last 168 hours. ------------------------------------------------------------------------------------------------------------------  RADIOLOGY:  No results found.  EKG:   Orders placed or performed during the hospital encounter of 10/02/16  . ED EKG  . ED EKG  . EKG 12-Lead  . EKG 12-Lead    ASSESSMENT AND PLAN:   53 year old male patient with gallstone pancreatitis: Lipase level trending down,, denies abdominal pain, nausea,  tolerating soft diet. 2.  For gallstone pancreatitis seen by surgeon, possible lap chole on [place]..  Continue empiric antibiotics till then, change Zosyn to Unasyn, spoke to pharmacist. 3 ,hypokalemia, replaced potassium.,  Recheck labs tomorrow. Acute gallstone pancreatitis, lipase decreased from 18 50-1 10. All the records are reviewed and case discussed with Care Management/Social Workerr. Management plans discussed with the patient, family and they are in agreement.  CODE STATUS: Full code  TOTAL  TIME TAKING CARE OF THIS PATIENT: 35 minutes.   Likely discharge home in next 1 to 2 days, lap chole tomorrow [name] M.D on 07/03/2018 at 1:04 PM  Between 7am to 6pm - Pager - [phone]  After 6pm go to [email or web address] - password [email or web address] [facility]  Office  [phone]  CC: Primary care physician; Idelle Crouch, MD   Note: This dictation was prepared with Dragon dictation along with smaller phrase technology. Any transcriptional errors that result from this process are unintentional.

## 2018-07-03 NOTE — Plan of Care (Signed)

## 2018-07-04 ENCOUNTER — Encounter
Admission: EM | Disposition: A | Discharge status: Home / Self Care | Payer: Self-pay | Source: Home / Self Care | Attending: Internal Medicine

## 2018-07-04 ENCOUNTER — Inpatient Hospital Stay: Payer: BLUE CROSS/BLUE SHIELD | Admitting: Anesthesiology

## 2018-07-04 ENCOUNTER — Other Ambulatory Visit: Payer: Self-pay

## 2018-07-04 ENCOUNTER — Encounter: Payer: Self-pay | Admitting: Anesthesiology

## 2018-07-04 HISTORY — PX: CHOLECYSTECTOMY: SHX55

## 2018-07-04 LAB — COMPREHENSIVE METABOLIC PANEL
ALK PHOS: 104 U/L (ref 38–126)
ALT: 82 U/L — ABNORMAL HIGH (ref 0–44)
ANION GAP: 6 (ref 5–15)
AST: 24 U/L (ref 15–41)
Albumin: 3.2 g/dL — ABNORMAL LOW (ref 3.5–5.0)
BUN: 14 mg/dL (ref 6–20)
CO2: 27 mmol/L (ref 22–32)
Calcium: 8.3 mg/dL — ABNORMAL LOW (ref 8.9–10.3)
Chloride: 105 mmol/L (ref 98–111)
Creatinine, Ser: 0.91 mg/dL (ref 0.61–1.24)
GFR calc Af Amer: >60 mL/min (ref >60)
GFR calc non Af Amer: >60 mL/min (ref >60)
Glucose, Bld: 119 mg/dL — ABNORMAL HIGH (ref 70–99)
Potassium: 3.4 mmol/L — ABNORMAL LOW (ref 3.5–5.1)
Sodium: 138 mmol/L (ref 135–145)
Total Bilirubin: 0.9 mg/dL (ref 0.3–1.2)
Total Protein: 6.1 g/dL — ABNORMAL LOW (ref 6.5–8.1)

## 2018-07-04 LAB — CBC
HCT: 35.1 % — ABNORMAL LOW (ref 39.0–52.0)
HEMOGLOBIN: 11.7 g/dL — AB (ref 13.0–17.0)
MCH: 28 pg (ref 26.0–34.0)
MCHC: 33.3 g/dL (ref 30.0–36.0)
MCV: 84 fL (ref 80.0–100.0)
Platelets: 212 10*3/uL (ref 150–400)
RBC: 4.18 MIL/uL — ABNORMAL LOW (ref 4.22–5.81)
RDW: 13.9 % (ref 11.5–15.5)
WBC: 7.3 10*3/uL (ref 4.0–10.5)
nRBC: 0 % (ref 0.0–0.2)

## 2018-07-04 LAB — GLUCOSE, CAPILLARY
Glucose-Capillary: 108 mg/dL — ABNORMAL HIGH (ref 70–99)
Glucose-Capillary: 93 mg/dL (ref 70–99)
Glucose-Capillary: 85 mg/dL (ref 70–99)
Glucose-Capillary: 162 mg/dL — ABNORMAL HIGH (ref 70–99)
Glucose-Capillary: 159 mg/dL — ABNORMAL HIGH (ref 70–99)

## 2018-07-04 SURGERY — LAPAROSCOPIC CHOLECYSTECTOMY
Anesthesia: General

## 2018-07-04 MED ORDER — MIDAZOLAM HCL 5 MG/5ML IJ SOLN
INTRAMUSCULAR | Status: DC | PRN
  Administered 2018-07-04: 2 mg via INTRAVENOUS

## 2018-07-04 MED ORDER — VASOPRESSIN 20 UNIT/ML IV SOLN
INTRAVENOUS | Status: DC | PRN
  Administered 2018-07-04: 2 [IU] via INTRAVENOUS

## 2018-07-04 MED ORDER — ROCURONIUM BROMIDE 100 MG/10ML IV SOLN
INTRAVENOUS | Status: DC | PRN
  Administered 2018-07-04: 10 mg via INTRAVENOUS
  Administered 2018-07-04: 45 mg via INTRAVENOUS
  Administered 2018-07-04: 5 mg via INTRAVENOUS

## 2018-07-04 MED ORDER — SUGAMMADEX SODIUM 200 MG/2ML IV SOLN
INTRAVENOUS | Status: DC | PRN
  Administered 2018-07-04: 240 mg via INTRAVENOUS

## 2018-07-04 MED ORDER — GLYCOPYRROLATE 0.2 MG/ML IJ SOLN
INTRAMUSCULAR | Status: AC
  Filled 2018-07-04: qty 1

## 2018-07-04 MED ORDER — DEXAMETHASONE SODIUM PHOSPHATE 10 MG/ML IJ SOLN
INTRAMUSCULAR | Status: DC | PRN
  Administered 2018-07-04: 5 mg via INTRAVENOUS

## 2018-07-04 MED ORDER — LIDOCAINE-EPINEPHRINE (PF) 1 %-1:200000 IJ SOLN
INTRAMUSCULAR | Status: DC | PRN
  Administered 2018-07-04: 21 mL via INTRADERMAL

## 2018-07-04 MED ORDER — ONDANSETRON HCL 4 MG/2ML IJ SOLN
INTRAMUSCULAR | Status: DC | PRN
  Administered 2018-07-04: 4 mg via INTRAVENOUS

## 2018-07-04 MED ORDER — SUGAMMADEX SODIUM 500 MG/5ML IV SOLN
INTRAVENOUS | Status: AC
  Filled 2018-07-04: qty 5

## 2018-07-04 MED ORDER — ROCURONIUM BROMIDE 50 MG/5ML IV SOLN
INTRAVENOUS | Status: AC
  Filled 2018-07-04: qty 1

## 2018-07-04 MED ORDER — ONDANSETRON HCL 4 MG/2ML IJ SOLN
INTRAMUSCULAR | Status: AC
  Filled 2018-07-04: qty 2

## 2018-07-04 MED ORDER — FENTANYL CITRATE (PF) 250 MCG/5ML IJ SOLN
INTRAMUSCULAR | Status: AC
  Filled 2018-07-04: qty 5

## 2018-07-04 MED ORDER — FENTANYL CITRATE (PF) 100 MCG/2ML IJ SOLN
25.0000 ug | INTRAMUSCULAR | Status: AC | PRN
  Administered 2018-07-04 (×6): 25 ug via INTRAVENOUS

## 2018-07-04 MED ORDER — EPHEDRINE SULFATE 50 MG/ML IJ SOLN
INTRAMUSCULAR | Status: DC | PRN
  Administered 2018-07-04: 15 mg via INTRAVENOUS

## 2018-07-04 MED ORDER — HYDROMORPHONE HCL 1 MG/ML IJ SOLN
INTRAMUSCULAR | Status: AC
  Filled 2018-07-04: qty 1

## 2018-07-04 MED ORDER — LIDOCAINE HCL (PF) 2 % IJ SOLN
INTRAMUSCULAR | Status: AC
  Filled 2018-07-04: qty 10

## 2018-07-04 MED ORDER — FENTANYL CITRATE (PF) 100 MCG/2ML IJ SOLN
INTRAMUSCULAR | Status: AC
  Filled 2018-07-04: qty 2

## 2018-07-04 MED ORDER — VASOPRESSIN 20 UNIT/ML IV SOLN
INTRAVENOUS | Status: AC
  Filled 2018-07-04: qty 1

## 2018-07-04 MED ORDER — PROPOFOL 10 MG/ML IV BOLUS
INTRAVENOUS | Status: AC
  Filled 2018-07-04: qty 20

## 2018-07-04 MED ORDER — ACETAMINOPHEN 10 MG/ML IV SOLN
INTRAVENOUS | Status: AC
  Filled 2018-07-04: qty 100

## 2018-07-04 MED ORDER — ONDANSETRON HCL 4 MG/2ML IJ SOLN: 4.0000 mg | Freq: Once | INTRAMUSCULAR | Status: DC | PRN

## 2018-07-04 MED ORDER — MIDAZOLAM HCL 2 MG/2ML IJ SOLN
INTRAMUSCULAR | Status: AC
  Filled 2018-07-04: qty 2

## 2018-07-04 MED ORDER — LIDOCAINE HCL (CARDIAC) PF 100 MG/5ML IV SOSY
PREFILLED_SYRINGE | INTRAVENOUS | Status: DC | PRN
  Administered 2018-07-04: 60 mg via INTRAVENOUS

## 2018-07-04 MED ORDER — ACETAMINOPHEN 10 MG/ML IV SOLN
INTRAVENOUS | Status: DC | PRN
  Administered 2018-07-04: 1000 mg via INTRAVENOUS

## 2018-07-04 MED ORDER — FENTANYL CITRATE (PF) 100 MCG/2ML IJ SOLN
INTRAMUSCULAR | Status: DC | PRN
  Administered 2018-07-04: 100 ug via INTRAVENOUS
  Administered 2018-07-04 (×3): 50 ug via INTRAVENOUS

## 2018-07-04 MED ORDER — GLYCOPYRROLATE 0.2 MG/ML IJ SOLN
INTRAMUSCULAR | Status: DC | PRN
  Administered 2018-07-04: 0.2 mg via INTRAVENOUS

## 2018-07-04 MED ORDER — CEFAZOLIN SODIUM 1 G IJ SOLR
INTRAMUSCULAR | Status: AC
  Filled 2018-07-04: qty 20

## 2018-07-04 MED ORDER — PROPOFOL 10 MG/ML IV BOLUS
INTRAVENOUS | Status: DC | PRN
  Administered 2018-07-04: 200 mg via INTRAVENOUS

## 2018-07-04 MED ORDER — LACTATED RINGERS IV SOLN
INTRAVENOUS | Status: DC
  Administered 2018-07-04 – 2018-07-05 (×2): via INTRAVENOUS

## 2018-07-04 MED ORDER — SUCCINYLCHOLINE CHLORIDE 20 MG/ML IJ SOLN
INTRAMUSCULAR | Status: DC | PRN
  Administered 2018-07-04: 120 mg via INTRAVENOUS

## 2018-07-04 MED ORDER — PHENYLEPHRINE HCL 10 MG/ML IJ SOLN
INTRAMUSCULAR | Status: DC | PRN
  Administered 2018-07-04: 100 ug via INTRAVENOUS
  Administered 2018-07-04: 200 ug via INTRAVENOUS
  Administered 2018-07-04: 100 ug via INTRAVENOUS

## 2018-07-04 MED ORDER — PHENYLEPHRINE HCL 10 MG/ML IJ SOLN
INTRAMUSCULAR | Status: AC
  Filled 2018-07-04: qty 1

## 2018-07-04 MED ORDER — SODIUM CHLORIDE 0.9 % IV SOLN
3.0000 g | Freq: Three times a day (TID) | INTRAVENOUS | Status: DC
  Administered 2018-07-04 – 2018-07-05 (×3): 3 g via INTRAVENOUS
  Filled 2018-07-04 (×7): qty 3

## 2018-07-04 MED ORDER — SODIUM CHLORIDE 0.9 % IV SOLN
INTRAVENOUS | Status: DC | PRN
  Administered 2018-07-04: 16:00:00 via INTRAVENOUS

## 2018-07-04 MED ORDER — OXYCODONE HCL 5 MG PO TABS
5.0000 mg | ORAL_TABLET | ORAL | Status: DC | PRN
  Administered 2018-07-04 – 2018-07-05 (×2): 5 mg via ORAL
  Filled 2018-07-04 (×2): qty 1

## 2018-07-04 MED ORDER — CEFAZOLIN SODIUM-DEXTROSE 2-3 GM-%(50ML) IV SOLR
INTRAVENOUS | Status: DC | PRN
  Administered 2018-07-04: 2 g via INTRAVENOUS

## 2018-07-04 MED ORDER — SUCCINYLCHOLINE CHLORIDE 20 MG/ML IJ SOLN
INTRAMUSCULAR | Status: AC
  Filled 2018-07-04: qty 1

## 2018-07-04 MED ORDER — EPHEDRINE SULFATE 50 MG/ML IJ SOLN
INTRAMUSCULAR | Status: AC
  Filled 2018-07-04: qty 1

## 2018-07-04 MED ORDER — DEXTROSE IN LACTATED RINGERS 5 % IV SOLN
INTRAVENOUS | Status: DC
  Administered 2018-07-04: 14:00:00 via INTRAVENOUS

## 2018-07-04 SURGICAL SUPPLY — 62 items
ANCHOR TIS RET SYS 235ML (MISCELLANEOUS) ×3 IMPLANT
APPLICATOR COTTON TIP 6 STRL (MISCELLANEOUS) IMPLANT
APPLICATOR COTTON TIP 6IN STRL (MISCELLANEOUS)
APPLIER CLIP 5 13 M/L LIGAMAX5 (MISCELLANEOUS) ×3
BLADE SURG SZ11 CARB STEEL (BLADE) ×3 IMPLANT
CANISTER SUCT 1200ML W/VALVE (MISCELLANEOUS) ×3 IMPLANT
CHLORAPREP W/TINT 26ML (MISCELLANEOUS) ×3 IMPLANT
CHOLANGIOGRAM CATH TAUT (CATHETERS) IMPLANT
CLIP APPLIE 5 13 M/L LIGAMAX5 (MISCELLANEOUS) ×1 IMPLANT
COVER WAND RF STERILE (DRAPES) ×3 IMPLANT
DECANTER SPIKE VIAL GLASS SM (MISCELLANEOUS) ×6 IMPLANT
DEFOGGER SCOPE WARMER CLEARIFY (MISCELLANEOUS) IMPLANT
DERMABOND ADVANCED (GAUZE/BANDAGES/DRESSINGS) ×2
DERMABOND ADVANCED .7 DNX12 (GAUZE/BANDAGES/DRESSINGS) ×1 IMPLANT
DISSECTOR BLUNT TIP ENDO 5MM (MISCELLANEOUS) IMPLANT
DISSECTOR KITTNER STICK (MISCELLANEOUS) IMPLANT
DISSECTORS/KITTNER STICK (MISCELLANEOUS)
DRAPE C-ARM XRAY 36X54 (DRAPES) IMPLANT
DRAPE SHEET LG 3/4 BI-LAMINATE (DRAPES) IMPLANT
ELECT CAUTERY BLADE 6.4 (BLADE) IMPLANT
ELECT REM PT RETURN 9FT ADLT (ELECTROSURGICAL) ×3
ELECTRODE REM PT RTRN 9FT ADLT (ELECTROSURGICAL) ×1 IMPLANT
ENDOLOOP SUT PDS II  0 18 (SUTURE) ×2
ENDOLOOP SUT PDS II 0 18 (SUTURE) ×1 IMPLANT
GLOVE BIOGEL PI IND STRL 7.0 (GLOVE) ×1 IMPLANT
GLOVE BIOGEL PI INDICATOR 7.0 (GLOVE) ×2
GLOVE SURG SYN 7.0 (GLOVE) ×3 IMPLANT
GOWN STRL REUS W/ TWL LRG LVL3 (GOWN DISPOSABLE) ×3 IMPLANT
GOWN STRL REUS W/TWL LRG LVL3 (GOWN DISPOSABLE) ×6
GRASPER SUT TROCAR 14GX15 (MISCELLANEOUS) ×3 IMPLANT
HEMOSTAT SURGICEL 2X14 (HEMOSTASIS) ×3 IMPLANT
IRRIGATION STRYKERFLOW (MISCELLANEOUS) IMPLANT
IRRIGATOR STRYKERFLOW (MISCELLANEOUS)
IV CATH ANGIO 12GX3 LT BLUE (NEEDLE) IMPLANT
IV NS 1000ML (IV SOLUTION)
IV NS 1000ML BAXH (IV SOLUTION) IMPLANT
JACKSON PRATT 10 (INSTRUMENTS) IMPLANT
JACKSON PRATT 7MM (INSTRUMENTS) ×3 IMPLANT
L-HOOK LAP DISP 36CM (ELECTROSURGICAL) ×3
LHOOK LAP DISP 36CM (ELECTROSURGICAL) ×1 IMPLANT
NEEDLE HYPO 22GX1.5 SAFETY (NEEDLE) ×3 IMPLANT
PACK LAP CHOLECYSTECTOMY (MISCELLANEOUS) ×3 IMPLANT
PENCIL ELECTRO HAND CTR (MISCELLANEOUS) ×3 IMPLANT
PORT ACCESS TROCAR AIRSEAL 5 (TROCAR) ×3 IMPLANT
SCISSORS METZENBAUM CVD 33 (INSTRUMENTS) ×3 IMPLANT
SET TRI-LUMEN FLTR TB AIRSEAL (TUBING) ×3 IMPLANT
SLEEVE ENDOPATH XCEL 5M (ENDOMECHANICALS) ×3 IMPLANT
SPONGE LAP 18X18 RF (DISPOSABLE) IMPLANT
STOPCOCK 4 WAY LG BORE MALE ST (IV SETS) IMPLANT
SUT ETHILON 3-0 FS-10 30 BLK (SUTURE) ×3
SUT MNCRL 4-0 (SUTURE) ×2
SUT MNCRL 4-0 27XMFL (SUTURE) ×1
SUT VIC AB 3-0 SH 27 (SUTURE)
SUT VIC AB 3-0 SH 27X BRD (SUTURE) IMPLANT
SUT VICRYL 0 AB UR-6 (SUTURE) ×6 IMPLANT
SUTURE EHLN 3-0 FS-10 30 BLK (SUTURE) ×1 IMPLANT
SUTURE MNCRL 4-0 27XMF (SUTURE) ×1 IMPLANT
SYR 20CC LL (SYRINGE) ×3 IMPLANT
TOWEL OR 17X26 4PK STRL BLUE (TOWEL DISPOSABLE) ×3 IMPLANT
TROCAR XCEL BLUNT TIP 100MML (ENDOMECHANICALS) ×3 IMPLANT
TROCAR XCEL NON-BLD 5MMX100MML (ENDOMECHANICALS) ×3 IMPLANT
WATER STERILE IRR 1000ML POUR (IV SOLUTION) ×3 IMPLANT

## 2018-07-04 NOTE — Anesthesia Preprocedure Evaluation (Signed)
Anesthesia Evaluation  Patient identified by MRN, date of birth, ID band Patient awake    Reviewed: Allergy & Precautions, NPO status , Patient's Chart, lab work & pertinent test results, reviewed documented beta blocker date and time   Airway Mallampati: III  TM Distance: >3 FB     Dental  (+) Chipped   Pulmonary           Cardiovascular      Neuro/Psych Anxiety    GI/Hepatic   Endo/Other  diabetes, Type 2  Renal/GU      Musculoskeletal  (+) Arthritis , Rheumatoid disorders,    Abdominal   Peds  Hematology   Anesthesia Other Findings Obese.  Reproductive/Obstetrics                             Anesthesia Physical Anesthesia Plan  ASA: III  Anesthesia Plan: General   Post-op Pain Management:    Induction: Intravenous  PONV Risk Score and Plan:   Airway Management Planned: Oral ETT  Additional Equipment:   Intra-op Plan:   Post-operative Plan:   Informed Consent: I have reviewed the patients History and Physical, chart, labs and discussed the procedure including the risks, benefits and alternatives for the proposed anesthesia with the patient or authorized representative who has indicated his/her understanding and acceptance.       Plan Discussed with: CRNA  Anesthesia Plan Comments:         Anesthesia Quick Evaluation

## 2018-07-04 NOTE — Anesthesia Postprocedure Evaluation (Signed)
Anesthesia Post Note  Patient: John Stanton.  Procedure(s) Performed: LAPAROSCOPIC CHOLECYSTECTOMY (N/A )  Patient location during evaluation: PACU Anesthesia Type: General Level of consciousness: awake and alert Pain management: pain level controlled Vital Signs Assessment: post-procedure vital signs reviewed and stable Respiratory status: spontaneous breathing, nonlabored ventilation, respiratory function stable and patient connected to nasal cannula oxygen Cardiovascular status: blood pressure returned to baseline and stable Postop Assessment: no apparent nausea or vomiting Anesthetic complications: no     Last Vitals:  Vitals:   07/04/18 1819 07/04/18 1824  BP:    Pulse: 98 86  Resp: (!) 21 (!) 23  Temp:    SpO2: 97% 98%    Last Pain:  Vitals:   07/04/18 1824  TempSrc:   PainSc: 10-Worst pain ever                 Molli Barrows

## 2018-07-04 NOTE — Transfer of Care (Signed)
Immediate Anesthesia Transfer of Care Note  Patient: John Stanton.  Procedure(s) Performed: LAPAROSCOPIC CHOLECYSTECTOMY (N/A )  Patient Location: PACU  Anesthesia Type:General  Level of Consciousness: sedated  Airway & Oxygen Therapy: Patient Spontanous Breathing and Patient connected to face mask oxygen  Post-op Assessment: Report given to RN and Post -op Vital signs reviewed and stable  Post vital signs: Reviewed and stable  Last Vitals:  Vitals Value Taken Time  BP 138/75 07/04/2018  5:56 PM  Temp    Pulse 88 07/04/2018  5:59 PM  Resp 13 07/04/2018  5:59 PM  SpO2 98 % 07/04/2018  5:59 PM  Vitals shown include unvalidated device data.  Last Pain:  Vitals:   07/04/18 1405  TempSrc: Tympanic  PainSc: 0-No pain      Patients Stated Pain Goal: 0 (68/37/29 0211)  Complications: No apparent anesthesia complications

## 2018-07-04 NOTE — Progress Notes (Signed)
Subjective:  CC:  John Stanton. is a 53 y.o. male  Hospital stay day 4, Day of Surgery gallstone pancreatitis  HPI: No issue overnight.  ROS:  General: Denies weight loss, weight gain, fatigue, fevers, chills, and night sweats. Heart: Denies chest pain, palpitations, racing heart, irregular heartbeat, leg pain or swelling, and decreased activity tolerance. Respiratory: Denies breathing difficulty, shortness of breath, wheezing, cough, and sputum. GI: Denies change in appetite, heartburn, nausea, vomiting, constipation, diarrhea, and blood in stool. GU: Denies difficulty urinating, pain with urinating, urgency, frequency, blood in urine.   Objective:      Temp:  [97.7 F (36.5 C)-99.2 F (37.3 C)] 97.7 F (36.5 C) (01/23 1405) Pulse Rate:  [68-76] 76 (01/23 1405) Resp:  [18-20] 18 (01/23 1405) BP: (143-154)/(78-92) 154/92 (01/23 1405) SpO2:  [96 %-100 %] 100 % (01/23 1405) Weight:  [117.9 kg] 117.9 kg (01/23 1405)     Height: 5\' 10"  (177.8 cm) Weight: 117.9 kg BMI (Calculated): 37.31   Intake/Output this shift:   Intake/Output Summary (Last 24 hours) at 07/04/2018 1458 Last data filed at 07/04/2018 1002 Gross per 24 hour  Intake 1554.65 ml  Output 2450 ml  Net -895.35 ml        Constitutional :  alert, cooperative, appears stated age and no distress  Respiratory:  clear to auscultation bilaterally  Cardiovascular:  regular rate and rhythm  Gastrointestinal: soft, no guarding, tenderness resolved in epigastric, RUQ region but minimal in far lateral RUQ.   Skin: Cool and moist.   Psychiatric: Normal affect, non-agitated, not confused       LABS:  CMP Latest Ref Rng & Units 07/04/2018 07/01/2018 06/30/2018  Glucose 70 - 99 mg/dL 119(H) 135(H) 186(H)  BUN 6 - 20 mg/dL 14 19 16   Creatinine 0.61 - 1.24 mg/dL 0.91 0.98 1.00  Sodium 135 - 145 mmol/L 138 138 140  Potassium 3.5 - 5.1 mmol/L 3.4(L) 3.2(L) 3.5  Chloride 98 - 111 mmol/L 105 106 105  CO2 22 - 32 mmol/L 27  26 22   Calcium 8.9 - 10.3 mg/dL 8.3(L) 8.0(L) 9.0  Total Protein 6.5 - 8.1 g/dL 6.1(L) 6.3(L) 7.5  Total Bilirubin 0.3 - 1.2 mg/dL 0.9 3.0(H) 3.8(H)  Alkaline Phos 38 - 126 U/L 104 140(H) 113  AST 15 - 41 U/L 24 152(H) 150(H)  ALT 0 - 44 U/L 82(H) 214(H) 150(H)   CBC Latest Ref Rng & Units 07/04/2018 07/01/2018 06/30/2018  WBC 4.0 - 10.5 K/uL 7.3 9.3 16.5(H)  Hemoglobin 13.0 - 17.0 g/dL 11.7(L) 12.8(L) 15.1  Hematocrit 39.0 - 52.0 % 35.1(L) 38.1(L) 43.6  Platelets 150 - 400 K/uL 212 215 301    RADS: n/a Assessment:  Gallstone pancreatitis. To OR today

## 2018-07-04 NOTE — Anesthesia Post-op Follow-up Note (Signed)
Anesthesia QCDR form completed.        

## 2018-07-04 NOTE — Progress Notes (Signed)
Called Dr. Jannifer Franklin regarding pain medication per patient request.  Appropriate orders were placed.  Christene Slates 07/04/2018  10:04 PM

## 2018-07-04 NOTE — Anesthesia Procedure Notes (Signed)
Procedure Name: Intubation Date/Time: 07/04/2018 3:24 PM Performed by: Dionne Bucy, CRNA Pre-anesthesia Checklist: Patient identified, Patient being monitored, Timeout performed, Emergency Drugs available and Suction available Patient Re-evaluated:Patient Re-evaluated prior to induction Oxygen Delivery Method: Circle system utilized Preoxygenation: Pre-oxygenation with 100% oxygen Induction Type: IV induction Ventilation: Mask ventilation with difficulty and Oral airway inserted - appropriate to patient size Laryngoscope Size: 3 and McGraph Grade View: Grade I Tube type: Oral Tube size: 7.0 mm Number of attempts: 1 Airway Equipment and Method: Stylet Placement Confirmation: ETT inserted through vocal cords under direct vision,  positive ETCO2 and breath sounds checked- equal and bilateral Secured at: 23 cm Tube secured with: Tape Dental Injury: Teeth and Oropharynx as per pre-operative assessment  Difficulty Due To: Difficulty was anticipated and Difficult Airway- due to reduced neck mobility

## 2018-07-04 NOTE — Progress Notes (Signed)
[place] at [place] NAME: John Stanton    MR#:  [id number]  DATE OF BIRTH:  1965-08-19  SUBJECTIVE no abdominal pain, for lap chole today.  CHIEF COMPLAINT:   Chief Complaint  Patient presents with  . Abdominal Pain  .  REVIEW OF SYSTEMS:   ROS CONSTITUTIONAL: No fever, fatigue or weakness.  EYES: No blurred or double vision.  EARS, NOSE, AND THROAT: No tinnitus or ear pain.  RESPIRATORY: No cough, shortness of breath, wheezing or hemoptysis.  CARDIOVASCULAR: No chest pain, orthopnea, edema.  GASTROINTESTINAL: No nausea, vomiting, diarrhea or abdominal pain.  GENITOURINARY: No dysuria, hematuria.  ENDOCRINE: No polyuria, nocturia,  HEMATOLOGY: No anemia, easy bruising or bleeding SKIN: No rash or lesion. MUSCULOSKELETAL: No joint pain or arthritis.   NEUROLOGIC: No tingling, numbness, weakness.  PSYCHIATRY: No anxiety or depression.   DRUG ALLERGIES:   Allergies  Allergen Reactions  . Infliximab Other (See Comments)  . Methotrexate Other (See Comments)  . Venlafaxine Other (See Comments)    ELEVATED BP  . Prednisone Rash    VITALS:  Blood pressure (!) 150/90, pulse 68, temperature 98.1 F (36.7 C), temperature source Oral, resp. rate 20, height 5\' 10"  (1.778 m), weight 117.9 kg, SpO2 99 %.  PHYSICAL EXAMINATION:  GENERAL:  53 y.o.-year-old patient lying in the bed with no acute distress.  EYES: Pupils equal, round, reactive to light and accommodation. No scleral icterus. Extraocular muscles intact.  HEENT: Head atraumatic, normocephalic. Oropharynx and nasopharynx clear.  NECK:  Supple, no jugular venous distention. No thyroid enlargement, no tenderness.  LUNGS: Normal breath sounds bilaterally, no wheezing, rales,rhonchi or crepitation. No use of accessory muscles of respiration.  CARDIOVASCULAR: S1, S2 normal. No murmurs, rubs, or gallops.  ABDOMEN: Soft, nontender, nondistended. Bowel sounds present. No  organomegaly or mass.  EXTREMITIES: No pedal edema, cyanosis, or clubbing.  NEUROLOGIC: Cranial nerves II through XII are intact. Muscle strength 5/5 in all extremities. Sensation intact. Gait not checked.  PSYCHIATRIC: The patient is alert and oriented x 3.  SKIN: No obvious rash, lesion, or ulcer.    LABORATORY PANEL:   CBC Recent Labs  Lab 07/04/18 0435  WBC 7.3  HGB 11.7*  HCT 35.1*  PLT 212   ------------------------------------------------------------------------------------------------------------------  Chemistries  Recent Labs  Lab 07/04/18 0435  NA 138  K 3.4*  CL 105  CO2 27  GLUCOSE 119*  BUN 14  CREATININE 0.91  CALCIUM 8.3*  AST 24  ALT 82*  ALKPHOS 104  BILITOT 0.9   ------------------------------------------------------------------------------------------------------------------  Cardiac Enzymes No results for input(s): TROPONINI in the last 168 hours. ------------------------------------------------------------------------------------------------------------------  RADIOLOGY:  No results found.  EKG:   Orders placed or performed during the hospital encounter of 10/02/16  . ED EKG  . ED EKG  . EKG 12-Lead  . EKG 12-Lead    ASSESSMENT AND PLAN:   53 year old male patient with gallstone pancreatitis: Lipase level trending down,, denies abdominal pain, nausea,  tolerating soft diet. 2.  For gallstone pancreatitis seen by surgeon, for lap chole today..  Continue empiric antibiotics till then, continue Unasyn.,  Likely discharge tomorrow.  Patient want to stay tonight and see how he does after surgery.  3 ,hypokalemia, replaced potassium.,  Potassium is still slightly low, replace after surgery orally with KCl 20 M EQ p.o. 1 time. Acute gallstone pancreatitis, lipase decreased from 18 50-1 10.  Patient AST and ALT are trending down, AST, ALT 150 each on admission along  with lipase up to 1850. Likely discharge home tomorrow if he feels well  after surgery. . All the records are reviewed and case discussed with Care Management/Social Workerr. Management plans discussed with the patient, family and they are in agreement.  CODE STATUS: Full code  TOTAL TIME TAKING CARE OF THIS PATIENT: 35 minutes.   Likely discharge home in next 1 to 2 days, lap chole tomorrow [name] M.D on 07/04/2018 at 1:20 PM  Between 7am to 6pm - Pager - [phone]  After 6pm go to [email or web address] - password [email or web address] [facility]  Office  [phone]  CC: Primary care physician; Idelle Crouch, MD   Note: This dictation was prepared with Dragon dictation along with smaller phrase technology. Any transcriptional errors that result from this process are unintentional.

## 2018-07-05 ENCOUNTER — Encounter: Payer: Self-pay | Admitting: Surgery

## 2018-07-05 LAB — BASIC METABOLIC PANEL
Anion gap: 7 (ref 5–15)
BUN: 13 mg/dL (ref 6–20)
CALCIUM: 8.5 mg/dL — AB (ref 8.9–10.3)
CO2: 29 mmol/L (ref 22–32)
Chloride: 103 mmol/L (ref 98–111)
Creatinine, Ser: 0.91 mg/dL (ref 0.61–1.24)
GFR calc non Af Amer: >60 mL/min (ref >60)
Glucose, Bld: 153 mg/dL — ABNORMAL HIGH (ref 70–99)
Potassium: 4.2 mmol/L (ref 3.5–5.1)
SODIUM: 139 mmol/L (ref 135–145)

## 2018-07-05 LAB — CBC WITH DIFFERENTIAL/PLATELET
Abs Immature Granulocytes: 0.03 10*3/uL (ref 0.00–0.07)
Basophils Absolute: 0 10*3/uL (ref 0.0–0.1)
Basophils Relative: 0 %
EOS PCT: 0 %
Eosinophils Absolute: 0 10*3/uL (ref 0.0–0.5)
HCT: 38.5 % — ABNORMAL LOW (ref 39.0–52.0)
Hemoglobin: 12.6 g/dL — ABNORMAL LOW (ref 13.0–17.0)
Immature Granulocytes: 0 %
Lymphocytes Relative: 3 %
Lymphs Abs: 0.3 10*3/uL — ABNORMAL LOW (ref 0.7–4.0)
MCH: 27.4 pg (ref 26.0–34.0)
MCHC: 32.7 g/dL (ref 30.0–36.0)
MCV: 83.7 fL (ref 80.0–100.0)
Monocytes Absolute: 0.6 10*3/uL (ref 0.1–1.0)
Monocytes Relative: 7 %
Neutro Abs: 7.8 10*3/uL — ABNORMAL HIGH (ref 1.7–7.7)
Neutrophils Relative %: 90 %
Platelets: 237 10*3/uL (ref 150–400)
RBC: 4.6 MIL/uL (ref 4.22–5.81)
RDW: 14 % (ref 11.5–15.5)
WBC: 8.6 10*3/uL (ref 4.0–10.5)
nRBC: 0 % (ref 0.0–0.2)

## 2018-07-05 LAB — HEPATIC FUNCTION PANEL
ALT: 76 U/L — ABNORMAL HIGH (ref 0–44)
AST: 29 U/L (ref 15–41)
Albumin: 3.5 g/dL (ref 3.5–5.0)
Alkaline Phosphatase: 105 U/L (ref 38–126)
Bilirubin, Direct: 0.2 mg/dL (ref 0.0–0.2)
Indirect Bilirubin: 1.1 mg/dL — ABNORMAL HIGH (ref 0.3–0.9)
Total Bilirubin: 1.3 mg/dL — ABNORMAL HIGH (ref 0.3–1.2)
Total Protein: 6.8 g/dL (ref 6.5–8.1)

## 2018-07-05 LAB — GLUCOSE, CAPILLARY
GLUCOSE-CAPILLARY: 112 mg/dL — AB (ref 70–99)
Glucose-Capillary: 118 mg/dL — ABNORMAL HIGH (ref 70–99)

## 2018-07-05 LAB — MAGNESIUM: Magnesium: 2.1 mg/dL (ref 1.7–2.4)

## 2018-07-05 LAB — PHOSPHORUS: Phosphorus: 3.8 mg/dL (ref 2.5–4.6)

## 2018-07-05 MED ORDER — AMOXICILLIN-POT CLAVULANATE 875-125 MG PO TABS: 1.0000 | ORAL_TABLET | Freq: Two times a day (BID) | ORAL | 0 refills | Status: AC

## 2018-07-05 MED ORDER — OXYCODONE-ACETAMINOPHEN 5-325 MG PO TABS: 1.0000 | ORAL_TABLET | Freq: Four times a day (QID) | ORAL | 0 refills | Status: DC | PRN

## 2018-07-05 NOTE — Discharge Summary (Signed)
[place] at [place] NAME: John Stanton    MR#:  [id number]  DATE OF BIRTH:  1966/01/08  DATE OF ADMISSION:  06/30/2018 ADMITTING PHYSICIAN: Sedalia Muta, MD  DATE OF DISCHARGE: 07/05/2018  PRIMARY CARE PHYSICIAN: Idelle Crouch, MD   ADMISSION DIAGNOSIS:  Generalized abdominal pain [R10.84] Calculus of gallbladder without cholecystitis without obstruction [K80.20] Intractable pain [R52] Abdominal pain [R10.9] Acute gallstone pancreatitis [K85.10] Non-intractable vomiting with nausea, unspecified vomiting type [R11.2] Acute pancreatitis, unspecified complication status, unspecified pancreatitis type [K85.90]  DISCHARGE DIAGNOSIS:  Acute gallstone pancreatitis Acute hypokalemia Abnormal liver function tests Abdominal pain  SECONDARY DIAGNOSIS:   Past Medical History:  Diagnosis Date  . Anxiety   . Collagen vascular disease ([place])   . Diabetes mellitus without complication ([place])   . Multiple nasal polyps    required ENT surgery by Dr. Carlis Abbott in (approximately) 2016  . Rheumatoid arthritis ([place])      ADMITTING HISTORY John Stanton  is a 53 y.o. male with a known history listed below presented to emergency room for evaluation of right-sided and epigastric abdominal pain associated with nausea and vomiting.  Patient drank some coffee yesterday morning and told his wife that it did not taste right.  Patient started having multiple episodes of nausea and vomiting all day.  No blood in vomit.  No diarrhea.  Patient noticed severe epigastric and right upper quadrant abdominal pain, sharp, constant, intolerable.  Patient also noticed chills without any fever.  Denies chest pain or shortness of breath.  No other complaints.  In emergency room patient has initial evaluation that showed elevated lipase.  Patient underwent CT scan, MRCP and ultrasound.  Patient has acute pancreatitis and gallstones.  No choledocholithiasis on MRCP.  Patient  received symptomatic treatment in the emergency room.  Hospitalist team requested for admission.  HOSPITAL COURSE:  Patient was admitted to medical floor.  Started on IV Unasyn antibiotic.  Patient received IV fluids.  Liver function tests for follow-up.. WBC trended down with IV antibiotics.  Surgery consultation was done.  During hospitalization patient was worked up with CT abdomen, MRI abdomen.  Patient underwent laparoscopic cholecystectomy.  Patient tolerated procedure well.  Liver function tests became normal during the follow-up in the hospital.  WBC count was also normal at the time of discharge.  Patient tolerated diet well and will be discharged home on oral Augmentin antibiotic and follow-up with surgical attending in the clinic.  CONSULTS OBTAINED:  Treatment Team:  Benjamine Sprague, DO  DRUG ALLERGIES:   Allergies  Allergen Reactions  . Infliximab Other (See Comments)  . Methotrexate Other (See Comments)  . Venlafaxine Other (See Comments)    ELEVATED BP  . Prednisone Rash    DISCHARGE MEDICATIONS:   Allergies as of 07/05/2018      Reactions   Infliximab Other (See Comments)   Methotrexate Other (See Comments)   Venlafaxine Other (See Comments)   ELEVATED BP   Prednisone Rash      Medication List    STOP taking these medications   celecoxib 200 MG capsule Commonly known as:  CELEBREX     TAKE these medications   ALPRAZolam 0.5 MG tablet Commonly known as:  XANAX Take 0.5-1 mg by mouth at bedtime as needed for sleep. for sleep   amoxicillin-clavulanate 875-125 MG tablet Commonly known as:  AUGMENTIN Take 1 tablet by mouth every 12 (twelve) hours for 5 days.   bisoprolol-hydrochlorothiazide 5-6.25 MG tablet Commonly known as:  ZIAC Take 1 tablet by mouth daily.   citalopram 20 MG tablet Commonly known as:  CELEXA Take 20 mg by mouth daily.   DULoxetine 30 MG capsule Commonly known as:  CYMBALTA Take 60 mg by mouth daily.   oxyCODONE-acetaminophen  5-325 MG tablet Commonly known as:  PERCOCET Take 1 tablet by mouth every 6 (six) hours as needed for moderate pain or severe pain.   pravastatin 40 MG tablet Commonly known as:  PRAVACHOL Take 40 mg by mouth daily.   tiZANidine 4 MG tablet Commonly known as:  ZANAFLEX Take 1 tablet by mouth at bedtime as needed.   XELJANZ 5 MG Tabs Generic drug:  Tofacitinib Citrate Take 5 mg by mouth 2 (two) times daily.       Today  Patient seen today Decreased abdominal pain Tolerated diet well No fever Hemodynamically stable Has biliary drain Please be discharged home and follow-up with surgical attending in the clinic on Monday  VITAL SIGNS:  Blood pressure (!) 146/88, pulse 82, temperature 99 F (37.2 C), temperature source Oral, resp. rate 18, height 5\' 10"  (1.778 m), weight 118.8 kg, SpO2 95 %.  I/O:    Intake/Output Summary (Last 24 hours) at 07/05/2018 1222 Last data filed at 07/05/2018 3329 Gross per 24 hour  Intake 3020.54 ml  Output 1764 ml  Net 1256.54 ml    PHYSICAL EXAMINATION:  Physical Exam  GENERAL:  53 y.o.-year-old patient lying in the bed with no acute distress.  LUNGS: Normal breath sounds bilaterally, no wheezing, rales,rhonchi or crepitation. No use of accessory muscles of respiration.  CARDIOVASCULAR: S1, S2 normal. No murmurs, rubs, or gallops.  ABDOMEN: Soft, non-tender, non-distended. Bowel sounds present. No organomegaly or mass.  Has biliary drain NEUROLOGIC: Moves all 4 extremities. PSYCHIATRIC: The patient is alert and oriented x 3.  SKIN: No obvious rash, lesion, or ulcer.   DATA REVIEW:   CBC Recent Labs  Lab 07/05/18 0451  WBC 8.6  HGB 12.6*  HCT 38.5*  PLT 237    Chemistries  Recent Labs  Lab 07/05/18 0451  NA 139  K 4.2  CL 103  CO2 29  GLUCOSE 153*  BUN 13  CREATININE 0.91  CALCIUM 8.5*  MG 2.1  AST 29  ALT 76*  ALKPHOS 105  BILITOT 1.3*    Cardiac Enzymes No results for input(s): TROPONINI in the last 168  hours.  Microbiology Results  Results for orders placed or performed during the hospital encounter of 10/02/16  Urine culture     Status: None   Collection Time: 10/02/16  6:20 PM  Result Value Ref Range Status   Specimen Description URINE, RANDOM  Final   Special Requests NONE  Final   Culture   Final    NO GROWTH Performed at [facility], [place] [address] [place], [place] [address]    Report Status 10/04/2016 FINAL  Final    RADIOLOGY:  No results found.  Follow up with PCP in 1 week.  Management plans discussed with the patient, family and they are in agreement.  CODE STATUS: Full code    Code Status Orders  (From admission, onward)         Start     Ordered   06/30/18 0635  Full code  Continuous     06/30/18 0634        Code Status History    This patient has a current code status but no historical code status.  TOTAL TIME TAKING CARE OF THIS PATIENT ON DAY OF DISCHARGE: more than 34 minutes.   Saundra Shelling M.D on 07/05/2018 at 12:22 PM  Between 7am to 6pm - Pager - [phone]  After 6pm go to [email or web address] - password [email or web address] [facility]  Office  [phone]  CC: Primary care physician; Idelle Crouch, MD  Note: This dictation was prepared with Dragon dictation along with smaller phrase technology. Any transcriptional errors that result from this process are unintentional.

## 2018-07-05 NOTE — Progress Notes (Signed)
Advanced care plan.  Purpose of the Encounter: CODE STATUS  Parties in Attendance: Patient  Patient's Decision Capacity: Good  Subjective/Patient's story: Presented to emergency room for abdominal pain  Objective/Medical story Patient has gallstone pancreatitis Needs laparoscopic cholecystectomy Surgery evaluation  Goals of care determination:  Advance care directives goals of care and treatment plan discussed Patient wants everything done which includes CPR, intubation ventilator if the need arises   CODE STATUS: Full code   Time spent discussing advanced care planning: 16 minutes

## 2018-07-05 NOTE — Op Note (Signed)
Preoperative diagnosis:  pancreatitis  Postoperative diagnosis: same as above plus acute cholecystitis  Procedure: Laparoscopic Cholecystectomy.   Anesthesia: GETA   Surgeon: Benjamine Sprague  Specimen: Gallbladder  Complications: None  EBL: 93mL  Wound Classification: Clean Contaminated  Indications: see HPI  Findings: Cystic duct and artery identified, ligated and divided, clips remained intact at end of procedure Adequate hemostasis  Description of procedure: The patient was placed on the operating table in the supine position. SCDs placed, pre-op abx administered.  General anesthesia was induced and OG tube placed by anesthesia. A time-out was completed verifying correct patient, procedure, site, positioning, and implant(s) and/or special equipment prior to beginning this procedure. The abdomen was prepped and draped in the usual sterile fashion.  An incision was made in a natural skin line under the umbilicus.  Dissection carried down to fascia where two 0 vicryl sutures placed to use as anchor sutures for hasson port.  Incision made into fascia and blunt dissection used to enter peritoneum.  Hasson port placed and insufflation started up to 36mm Hg without any dramatic increase in pressure.    The laparoscope was inserted and the abdomen inspected. No injuries from initial trocar placement were noted. Additional trocars were then inserted under direct visualization in the following locations: a 5-mm trocar in the subxyphoid region and two 5-mm trocars along the right costal margin. The abdomen was inspected and no abnormalities or injuries were found. The table was placed in the reverse Trendelenburg position with the right side up.  Dense adhesions and thick peritoneal lining noted surrounding the gallbladder and between the gallbladder, omentum, duodenum and transverse colon. The dome of the gallbladder was grasped with an atraumatic grasper passed through the lateral port and  retracted over the dome of the liver. The infundibulum was also grasped with an atraumatic grasper and retracted toward the right lower quadrant. This maneuver exposed Calot's triangle area.  Extensive dissection carried out and cystic duct and artery able to be isolated.  Cystic duct and cystic artery clipped and divided.  0 PDS endoloop used to further ligate the cystic stump due to extensive inflammation and thickened tissue.  The gallbladder was then dissected from its peritoneal and liver bed attachments by electrocautery. Hemostasis was checked and the gallbladder was removed using an endoscopic retrieval bag placed through the umbilical port. The gallbladder was passed off the table as a specimen. The gallbladder fossa inspected and noted to have portions of the posterior gallbladder wall still densely adhered to the liver fossa due to inflammation and loss of tissue planes.  Area was copiously irrigated with saline and any leaked bile was suctioned out, and hemostasis was obtained. There was no evidence of bleeding from the gallbladder fossa or cystic artery or leakage of the bile from the cystic duct stump.  Due to extensive inflammation, surgicell and a blake drain was left in the fossa.  The umbilical trocar removed and port site closed with PMI using 0 vicryl under direct vision.  Abdomen desufflated and secondary trocars were removed under direct vision. No bleeding was noted.  0 vicryl used to reinforce umbilical closure.  3-0 vicryl used to close deep dermal layer at umbilical site.  All skin incisions then closed with subcuticular sutures of 4-0 monocryl and dressed with topical skin adhesive.  Drain secured to skin using 3-0 nylon.  The orogastric tube was removed and patient extubated. The patient tolerated the procedure well and was taken to the postanesthesia care unit in stable condition.  All sponge and instrument count correct at end of procedure.

## 2018-07-08 LAB — SURGICAL PATHOLOGY

## 2018-08-05 ENCOUNTER — Other Ambulatory Visit: Payer: Self-pay | Admitting: Physical Medicine and Rehabilitation

## 2018-08-05 DIAGNOSIS — M5416 Radiculopathy, lumbar region: Secondary | ICD-10-CM

## 2018-08-08 ENCOUNTER — Other Ambulatory Visit: Payer: Self-pay | Admitting: Physical Medicine and Rehabilitation

## 2018-08-08 DIAGNOSIS — M5416 Radiculopathy, lumbar region: Secondary | ICD-10-CM

## 2018-08-14 ENCOUNTER — Ambulatory Visit: Payer: Disability Insurance

## 2018-08-21 ENCOUNTER — Ambulatory Visit
Admission: RE | Admit: 2018-08-21 | Discharge: 2018-08-21 | Disposition: A | Discharge status: Home / Self Care | Payer: BLUE CROSS/BLUE SHIELD | Source: Ambulatory Visit | Attending: Physical Medicine and Rehabilitation | Admitting: Physical Medicine and Rehabilitation

## 2018-08-21 ENCOUNTER — Other Ambulatory Visit: Payer: Self-pay

## 2018-08-21 DIAGNOSIS — M5416 Radiculopathy, lumbar region: Secondary | ICD-10-CM

## 2018-09-22 IMAGING — MR MR HEAD WO/W CM
13 series · 48 of 48 positions shown · IV contrast (multihance)
Comparison: CT earlier same day

CLINICAL DATA: Headache.  Tingling.  Left temporal occipital mass.

EXAM:
MRI HEAD WITHOUT AND WITH CONTRAST
TECHNIQUE: Multiplanar, multiecho pulse sequences of the brain and surrounding
structures were obtained without and with intravenous contrast.
CONTRAST:  20mL MULTIHANCE GADOBENATE DIMEGLUMINE 529 MG/ML IV SOLN

[Series 2: T1 · sagittal · 5.0mm · 0.45mm/px · 1 of 29 slices shown (1 of 2)]
[im 1/29]
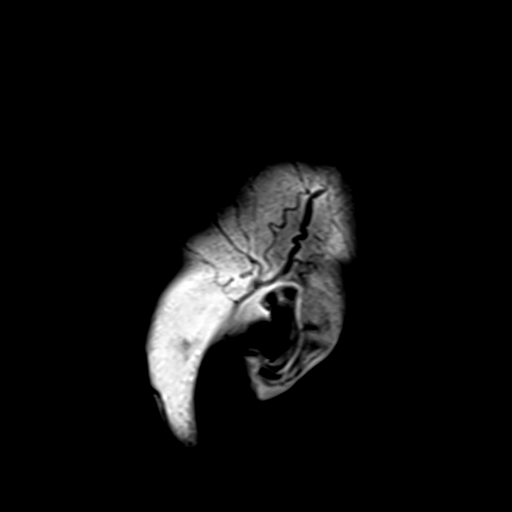

[Series 4: DWI · axial · 3.0mm · 1.80mm/px · z∈[-65,+89]mm · 3 of 55 slices shown (1 of 2)]
[im 1/55]
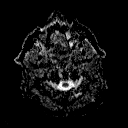
[im 28/55]
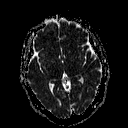
[im 55/55]
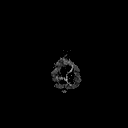

[Series 6: DWI · coronal · 3.0mm · 1.80mm/px · 3 of 49 slices shown (2 of 2)]
[im 1/49]
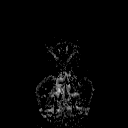
[im 25/49]
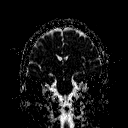
[im 49/49]
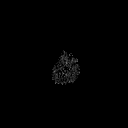

[Series 7: T2 · axial · 5.0mm · 0.60mm/px · z∈[-62,+87]mm · 2 of 25 slices shown (1 of 2)]
[im 1/25]
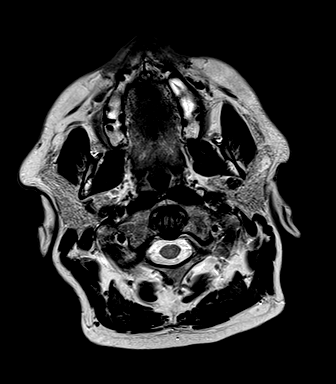
[im 25/25]
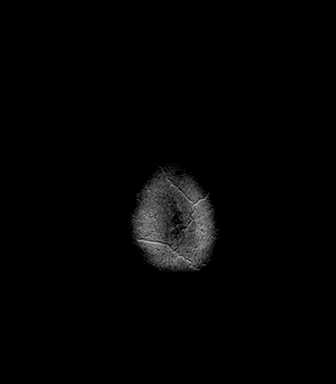

[Series 8: FLAIR · axial · 3.0mm · 0.45mm/px · z∈[-62,+87]mm · 3 of 53 slices shown]
[im 1/53]
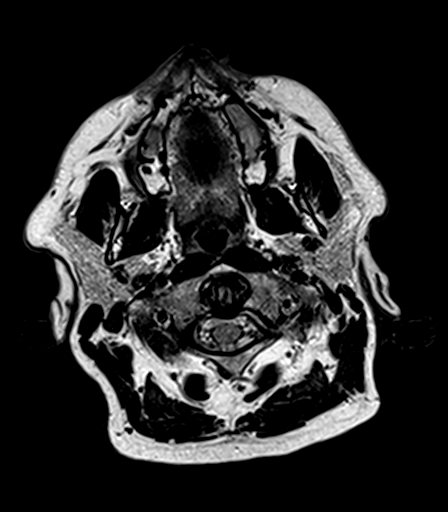
[im 27/53]
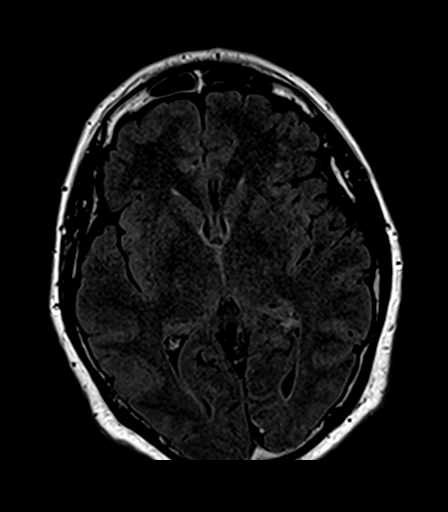
[im 53/53]
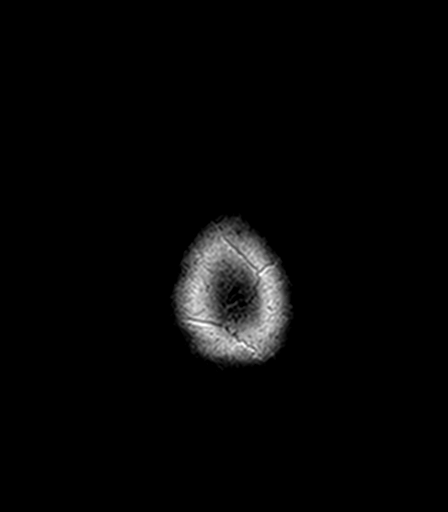

[Series 9: T2 · axial · 5.0mm · 0.45mm/px · z∈[-62,+87]mm · 2 of 25 slices shown (2 of 2)]
[im 1/25]
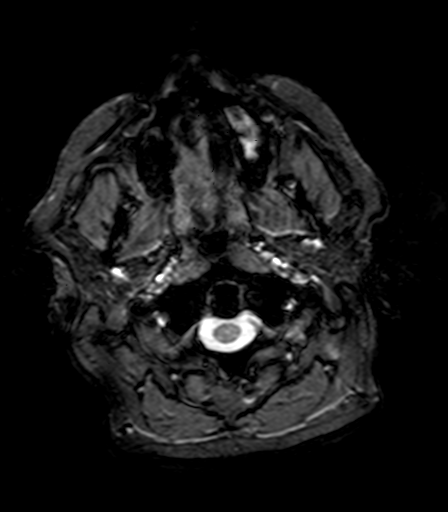
[im 25/25]
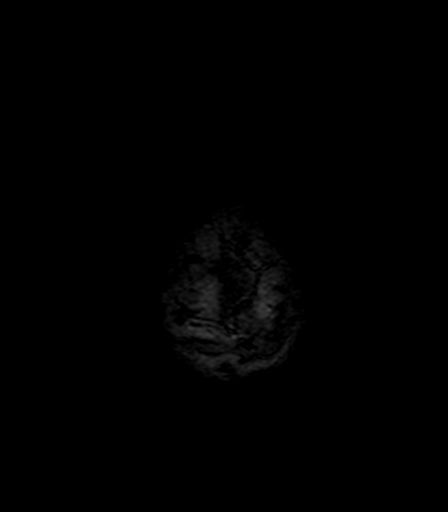

[Series 10: T1 · axial · 1.0mm · 1.00mm/px · z∈[-67,+100]mm · 11 of 176 slices shown (2 of 2)]
[im 1/176]
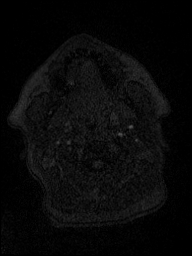
[im 18/176]
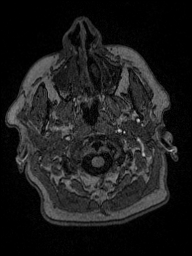
[im 36/176]
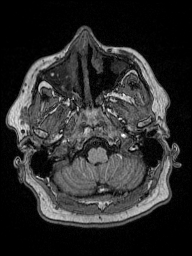
[im 53/176]
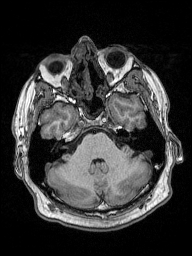
[im 71/176]
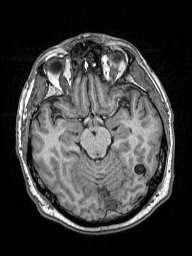
[im 88/176]
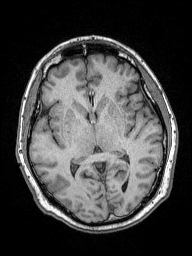
[im 106/176]
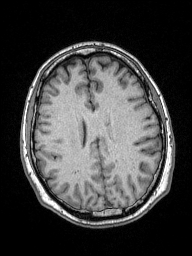
[im 123/176]
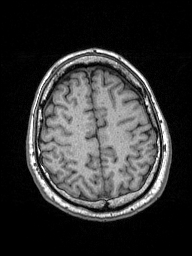
[im 141/176]
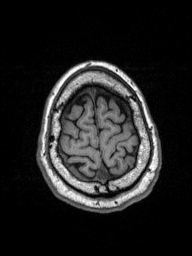
[im 158/176]
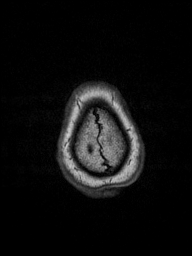
[im 176/176]
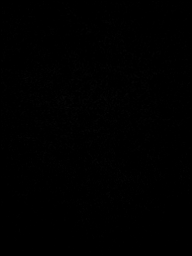

[Series 11: T2 post-contrast · coronal · 5.0mm · 0.49mm/px · 2 of 31 slices shown]
[im 1/31]
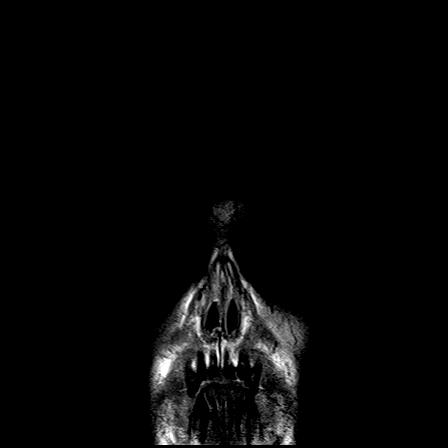
[im 31/31]
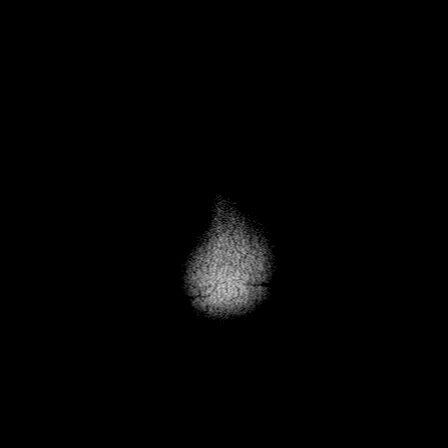

[Series 12: T1 post-contrast · axial · 1.0mm · 1.00mm/px · z∈[-67,+100]mm · 11 of 176 slices shown (1 of 3)]
[im 1/176]
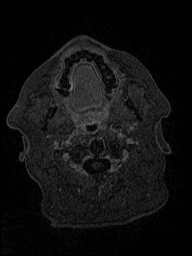
[im 18/176]
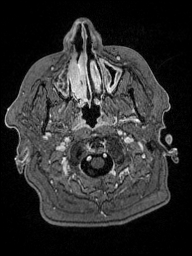
[im 36/176]
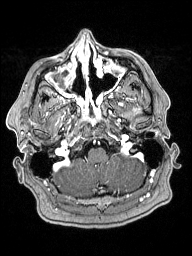
[im 53/176]
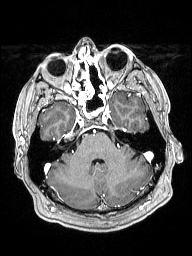
[im 71/176]
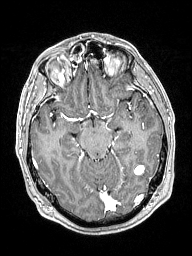
[im 88/176]
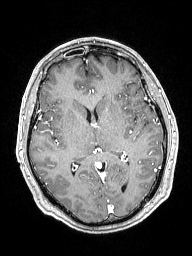
[im 106/176]
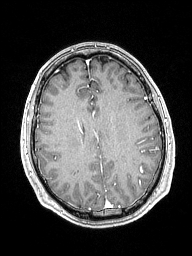
[im 123/176]
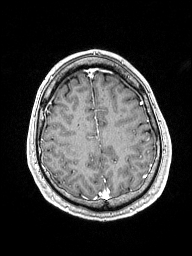
[im 141/176]
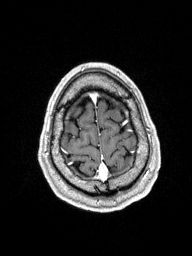
[im 158/176]
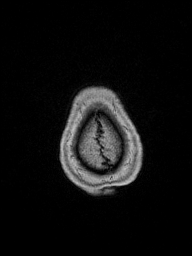
[im 176/176]
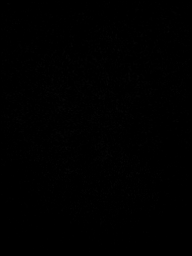

[Series 13: T1 post-contrast · coronal · 5.0mm · 0.43mm/px · 2 of 31 slices shown (2 of 3)]
[im 1/31]
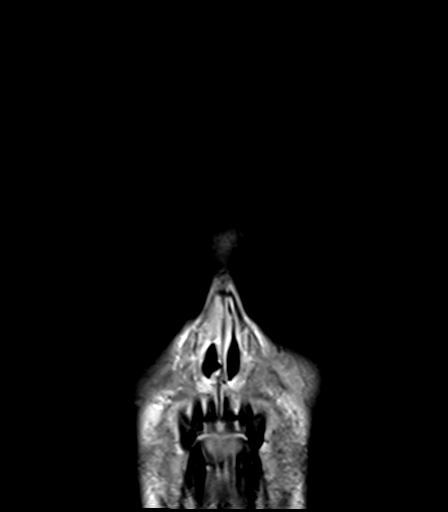
[im 31/31]
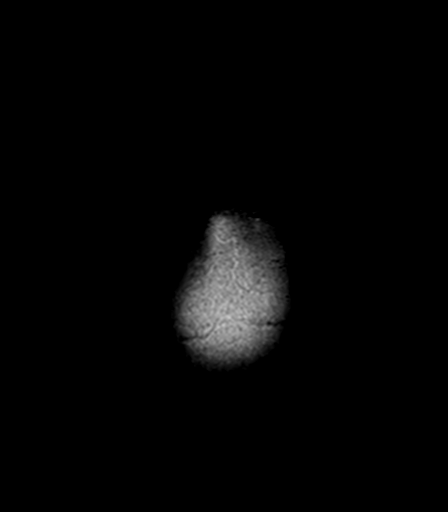

[Series 14: T1 post-contrast · sagittal · 5.0mm · 0.45mm/px · 2 of 29 slices shown (3 of 3)]
[im 1/29]
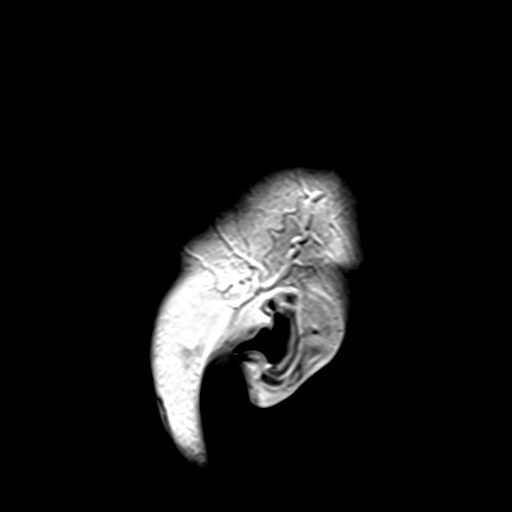
[im 29/29]
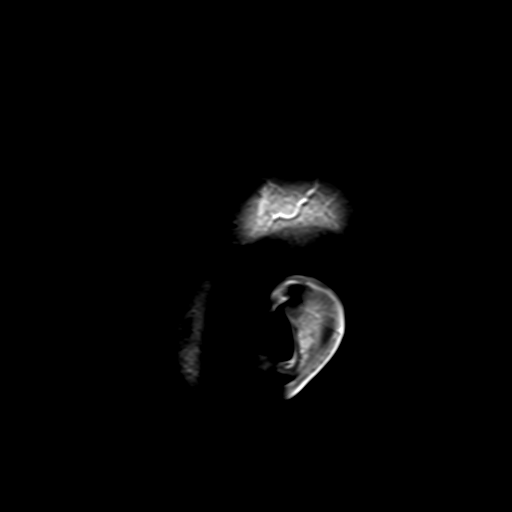

[Series 100: ax (id) · axial · 3.0mm · 1.80mm/px · z∈[-65,+89]mm · 3 of 54 slices shown]
[im 1/54]
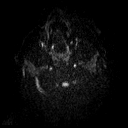
[im 27/54]
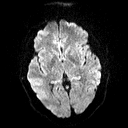
[im 54/54]
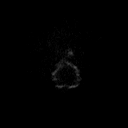

[Series 101: cor (id) · coronal · 3.0mm · 1.80mm/px · 3 of 49 slices shown]
[im 1/49]
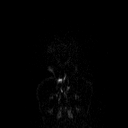
[im 25/49]
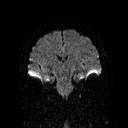
[im 49/49]
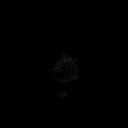

[48 of 48 positions shown; findings below may reference images not displayed]

FINDINGS: Brain: Diffusion imaging does not show any acute or subacute
infarction or other cause of restricted diffusion. The brainstem and
cerebellum are normal. Right cerebral hemispheres normal without
evidence of old or acute infarction, mass lesion, hemorrhage,
hydrocephalus or extra-axial collection. In the left hemisphere, at
the temporal/ occipital junction just above the tentorium, there is
a 15 mm lesion which shows low signal on all pulse sequences and an
shows dense enhancement following contrast administration. The
surrounding brain does not show any edema. Therefore, this quite
likely to be a high-flow cavernous venous abnormality. There no
second brain enhancing or vascular finding.

Vascular: Major vessels at the base of the brain show flow.

Skull and upper cervical spine: Negative

Sinuses/Orbits: Extensive sinus inflammatory disease with previous
functional surgery.

Other: None significant
IMPRESSION: The CT abnormality at the left temporal occipital junction quite
likely represents an incidental 15 mm high-flow cavernoma. No sign
of any mass effect or surrounding vasogenic edema. No second
vascular lesion. This is likely to be incidental to the presentation
today.

Advanced inflammatory sinus disease that may relate more to the
presenting symptoms.

## 2019-07-23 ENCOUNTER — Other Ambulatory Visit: Payer: Self-pay

## 2019-07-23 ENCOUNTER — Emergency Department: Payer: Self-pay

## 2019-07-23 ENCOUNTER — Emergency Department
Admission: EM | Admit: 2019-07-23 | Discharge: 2019-07-23 | Disposition: A | Discharge status: Home / Self Care | Payer: Self-pay | Attending: Emergency Medicine | Admitting: Emergency Medicine

## 2019-07-23 DIAGNOSIS — U071 COVID-19: Secondary | ICD-10-CM | POA: Insufficient documentation

## 2019-07-23 DIAGNOSIS — R531 Weakness: Secondary | ICD-10-CM

## 2019-07-23 DIAGNOSIS — E1165 Type 2 diabetes mellitus with hyperglycemia: Secondary | ICD-10-CM | POA: Insufficient documentation

## 2019-07-23 DIAGNOSIS — E860 Dehydration: Secondary | ICD-10-CM | POA: Insufficient documentation

## 2019-07-23 DIAGNOSIS — Z79899 Other long term (current) drug therapy: Secondary | ICD-10-CM | POA: Insufficient documentation

## 2019-07-23 DIAGNOSIS — R739 Hyperglycemia, unspecified: Secondary | ICD-10-CM

## 2019-07-23 DIAGNOSIS — A419 Sepsis, unspecified organism: Secondary | ICD-10-CM

## 2019-07-23 LAB — CBC WITH DIFFERENTIAL/PLATELET
Abs Immature Granulocytes: 0.04 10*3/uL (ref 0.00–0.07)
Basophils Absolute: 0 10*3/uL (ref 0.0–0.1)
Basophils Relative: 0 %
Eosinophils Absolute: 0 10*3/uL (ref 0.0–0.5)
Eosinophils Relative: 1 %
HCT: 47.2 % (ref 39.0–52.0)
Hemoglobin: 16.7 g/dL (ref 13.0–17.0)
Immature Granulocytes: 1 %
Lymphocytes Relative: 11 %
Lymphs Abs: 0.4 10*3/uL — ABNORMAL LOW (ref 0.7–4.0)
MCH: 27 pg (ref 26.0–34.0)
MCHC: 35.4 g/dL (ref 30.0–36.0)
MCV: 76.4 fL — ABNORMAL LOW (ref 80.0–100.0)
Monocytes Absolute: 0.5 10*3/uL (ref 0.1–1.0)
Monocytes Relative: 15 %
Neutro Abs: 2.5 10*3/uL (ref 1.7–7.7)
Neutrophils Relative %: 72 %
Platelets: 160 10*3/uL (ref 150–400)
RBC: 6.18 MIL/uL — ABNORMAL HIGH (ref 4.22–5.81)
RDW: 14.8 % (ref 11.5–15.5)
WBC: 3.4 10*3/uL — ABNORMAL LOW (ref 4.0–10.5)
nRBC: 0 % (ref 0.0–0.2)

## 2019-07-23 LAB — GLUCOSE, CAPILLARY
Glucose-Capillary: 396 mg/dL — ABNORMAL HIGH (ref 70–99)
Glucose-Capillary: 280 mg/dL — ABNORMAL HIGH (ref 70–99)

## 2019-07-23 LAB — COMPREHENSIVE METABOLIC PANEL
ALT: 64 U/L — ABNORMAL HIGH (ref 0–44)
AST: 52 U/L — ABNORMAL HIGH (ref 15–41)
Albumin: 4 g/dL (ref 3.5–5.0)
Alkaline Phosphatase: 121 U/L (ref 38–126)
Anion gap: 16 — ABNORMAL HIGH (ref 5–15)
BUN: 15 mg/dL (ref 6–20)
CO2: 20 mmol/L — ABNORMAL LOW (ref 22–32)
Calcium: 8.8 mg/dL — ABNORMAL LOW (ref 8.9–10.3)
Chloride: 95 mmol/L — ABNORMAL LOW (ref 98–111)
Creatinine, Ser: 1.1 mg/dL (ref 0.61–1.24)
GFR calc Af Amer: >60 mL/min (ref >60)
GFR calc non Af Amer: >60 mL/min (ref >60)
Glucose, Bld: 396 mg/dL — ABNORMAL HIGH (ref 70–99)
Potassium: 3.8 mmol/L (ref 3.5–5.1)
Sodium: 131 mmol/L — ABNORMAL LOW (ref 135–145)
Total Bilirubin: 1.9 mg/dL — ABNORMAL HIGH (ref 0.3–1.2)
Total Protein: 7.8 g/dL (ref 6.5–8.1)

## 2019-07-23 LAB — URINALYSIS, COMPLETE (UACMP) WITH MICROSCOPIC
Bacteria, UA: NONE SEEN
Bilirubin Urine: NEGATIVE
Glucose, UA: >=500 mg/dL — AB
Ketones, ur: 80 mg/dL — AB
Leukocytes,Ua: NEGATIVE
Nitrite: NEGATIVE
Protein, ur: NEGATIVE mg/dL
Specific Gravity, Urine: 1.028 (ref 1.005–1.030)
Squamous Epithelial / HPF: NONE SEEN (ref 0–5)
pH: 6 (ref 5.0–8.0)

## 2019-07-23 LAB — LACTIC ACID, PLASMA
Lactic Acid, Venous: 2.5 mmol/L (ref 0.5–1.9)
Lactic Acid, Venous: 2.3 mmol/L (ref 0.5–1.9)
Lactic Acid, Venous: 2.1 mmol/L (ref 0.5–1.9)

## 2019-07-23 LAB — POC SARS CORONAVIRUS 2 AG: SARS Coronavirus 2 Ag: POSITIVE — AB

## 2019-07-23 LAB — BLOOD GAS, VENOUS
Acid-base deficit: 0 mmol/L (ref 0.0–2.0)
Bicarbonate: 26.4 mmol/L (ref 20.0–28.0)
FIO2: 0.21
O2 Saturation: 40.7 %
Patient temperature: 37
pCO2, Ven: 49 mmHg (ref 44.0–60.0)
pH, Ven: 7.34 (ref 7.250–7.430)
pO2, Ven: <31 mmHg — CL (ref 32.0–45.0)

## 2019-07-23 LAB — PROTIME-INR
INR: 1.1 (ref 0.8–1.2)
Prothrombin Time: 13.7 seconds (ref 11.4–15.2)

## 2019-07-23 LAB — BASIC METABOLIC PANEL
Anion gap: 10 (ref 5–15)
BUN: 13 mg/dL (ref 6–20)
CO2: 24 mmol/L (ref 22–32)
Calcium: 7.5 mg/dL — ABNORMAL LOW (ref 8.9–10.3)
Chloride: 102 mmol/L (ref 98–111)
Creatinine, Ser: 0.99 mg/dL (ref 0.61–1.24)
GFR calc Af Amer: >60 mL/min (ref >60)
GFR calc non Af Amer: >60 mL/min (ref >60)
Glucose, Bld: 283 mg/dL — ABNORMAL HIGH (ref 70–99)
Potassium: 3.3 mmol/L — ABNORMAL LOW (ref 3.5–5.1)
Sodium: 136 mmol/L (ref 135–145)

## 2019-07-23 MED ORDER — ONDANSETRON HCL 4 MG PO TABS: 4.0000 mg | ORAL_TABLET | Freq: Three times a day (TID) | ORAL | 0 refills | Status: DC | PRN

## 2019-07-23 MED ORDER — SODIUM CHLORIDE 0.9 % IV BOLUS
1000.0000 mL | Freq: Once | INTRAVENOUS | Status: AC
  Administered 2019-07-23: 1000 mL via INTRAVENOUS

## 2019-07-23 MED ORDER — METFORMIN HCL 500 MG PO TABS: 500.0000 mg | ORAL_TABLET | Freq: Two times a day (BID) | ORAL | 11 refills | Status: AC

## 2019-07-23 MED ORDER — SODIUM CHLORIDE 0.9 % IV BOLUS
1000.0000 mL | Freq: Once | INTRAVENOUS | Status: AC
  Administered 2019-07-23: 16:00:00 1000 mL via INTRAVENOUS

## 2019-07-23 NOTE — ED Notes (Signed)
Pt given sandwich tray and beverage.

## 2019-07-23 NOTE — ED Triage Notes (Signed)
Pt c/o generalized weakness with diarrhea and bodyaches for the past several days, states his girl friend that lives in the same household was dx with covid 2 weeks ago. Pt is clammy on arrival.

## 2019-07-23 NOTE — ED Provider Notes (Signed)
[facility] Emergency Department Provider Note   ____________________________________________   I have reviewed the triage vital signs and the nursing notes.   HISTORY  Chief Complaint Weakness   History limited by: Not Limited   HPI John Stanton John Stanton. is a 54 y.o. male who presents to the emergency department today with concerns for weakness, decreased appetite, blurry vision. He states that these symptoms have been ongoing for the past 2 weeks. He has chronic fatigue issues related to his RA but has noticed increased fatigue and increased sleeping over the past two weeks. Also has noticed blurry vision. Does have a history of diabetes but states he was able to come off of his medications a couple of years ago, and thus, does not regularly check his sugar levels. The patient called his PCP today who recommended he come to the emergency department today for evaluation.   Records reviewed. Per medical record review patient has a history of diabetes.   Past Medical History:  Diagnosis Date  . Anxiety   . Collagen vascular disease ([place])   . Diabetes mellitus without complication ([place])   . Multiple nasal polyps    required ENT surgery by Dr. Carlis Abbott in (approximately) 2016  . Rheumatoid arthritis [facility])     Patient Active Problem List   Diagnosis Date Noted  . Pancreatitis 06/30/2018    Past Surgical History:  Procedure Laterality Date  . CHOLECYSTECTOMY N/A 07/04/2018   Procedure: LAPAROSCOPIC CHOLECYSTECTOMY;  Surgeon: Benjamine Sprague, DO;  Location: [facility] ORS;  Service: General;  Laterality: N/A;  . FOOT SURGERY    . NASAL POLYP EXCISION    . WRIST SURGERY      Prior to Admission medications   Medication Sig Start Date End Date Taking? Authorizing Provider  ALPRAZolam Duanne Moron) 0.5 MG tablet Take 0.5-1 mg by mouth at bedtime as needed for sleep. for sleep 09/11/16   [provider]  bisoprolol-hydrochlorothiazide (ZIAC) 5-6.25 MG tablet Take 1  tablet by mouth daily. 06/24/18   [provider]  citalopram (CELEXA) 20 MG tablet Take 20 mg by mouth daily. 02/10/16   [provider]  DULoxetine (CYMBALTA) 30 MG capsule Take 60 mg by mouth daily. 04/08/18   [provider]  oxyCODONE-acetaminophen (PERCOCET) 5-325 MG tablet Take 1 tablet by mouth every 6 (six) hours as needed for moderate pain or severe pain. 07/05/18   Salary, Avel Peace, MD  pravastatin (PRAVACHOL) 40 MG tablet Take 40 mg by mouth daily. 08/06/16   [provider]  tiZANidine (ZANAFLEX) 4 MG tablet Take 1 tablet by mouth at bedtime as needed. 06/17/18   [provider]  Tofacitinib Citrate (XELJANZ) 5 MG TABS Take 5 mg by mouth 2 (two) times daily.  07/28/16   [provider]    Allergies Infliximab, Methotrexate, Venlafaxine, and Prednisone  No family history on file.  Social History Social History   Tobacco Use  . Smoking status: Never Smoker  . Smokeless tobacco: Never Used  Substance Use Topics  . Alcohol use: No  . Drug use: No    Review of Systems Constitutional: No fever/chills Eyes: Positive for blurry vision ENT: Positive for dry throat.  Cardiovascular: Denies chest pain. Respiratory: Denies shortness of breath. Gastrointestinal: No abdominal pain.  No nausea, no vomiting.  No diarrhea.   Genitourinary: Negative for dysuria. Musculoskeletal: Negative for back pain. Skin: Negative for rash. Neurological: Negative for headaches, focal weakness or numbness.  ____________________________________________   PHYSICAL EXAM:  VITAL SIGNS:  ED Triage Vitals  Enc Vitals Group     BP 07/23/19 1429 (!) 136/93     Pulse Rate 07/23/19 1429 (!) 126     Resp 07/23/19 1429 (!) 22     Temp 07/23/19 1429 97.9 F (36.6 C)     Temp Source 07/23/19 1429 Oral     SpO2 07/23/19 1429 99 %     Weight 07/23/19 1431 254 lb (115.2 kg)     Height 07/23/19 1431 5\' 10"  (1.778 m)     Head Circumference --      Peak  Flow --      Pain Score 07/23/19 1436 0   Constitutional: Alert and oriented.  Eyes: Conjunctivae are normal.  ENT      Head: Normocephalic and atraumatic.      Nose: No congestion/rhinnorhea.      Mouth/Throat: Mucous membranes are moist.      Neck: No stridor. Hematological/Lymphatic/Immunilogical: No cervical lymphadenopathy. Cardiovascular: Tachycardia, regular rhythm.  No murmurs, rubs, or gallops.  Respiratory: Normal respiratory effort without tachypnea nor retractions. Breath sounds are clear and equal bilaterally. No wheezes/rales/rhonchi. Gastrointestinal: Soft and non tender. No rebound. No guarding.  Genitourinary: Deferred Musculoskeletal: Normal range of motion in all extremities. No lower extremity edema. Neurologic:  Normal speech and language. No gross focal neurologic deficits are appreciated.  Skin:  Skin is warm, dry and intact. No rash noted. Psychiatric: Mood and affect are normal. Speech and behavior are normal. Patient exhibits appropriate insight and judgment.  ____________________________________________    LABS (pertinent positives/negatives)  Lactic acid 2.5 -> 2.1 CMP na 131, cl 95, glu 396, anion gap 16 CBC wbc 3.4, hgb 16.7, plt 160 PH 7.34 COVID positive ____________________________________________   EKG  I, Nance Pear, attending physician, personally viewed and interpreted this EKG  EKG Time: 1432 Rate: 123 Rhythm: sinus tachycardia Axis: normal Intervals: qtc 463 QRS: narrow ST changes: no st elevation Impression: abnormal ekg  ____________________________________________    RADIOLOGY  CXR No acute disease  ____________________________________________   PROCEDURES  Procedures  ____________________________________________   INITIAL IMPRESSION / ASSESSMENT AND PLAN / ED COURSE  Pertinent labs & imaging results that were available during my care of the patient were reviewed by me and considered in my medical  decision making (see chart for details).   Patient presented to the emergency department today with primary concern for weakness, dehydration, blurry vision.  The symptoms have been present for the past couple of weeks.  On exam patient was initially found to be tachycardic.  Given concern for dry throat did have concern that patient was dehydrated.  Blood work did show elevated blood sugar.  Also showed elevated lactic.  Did think this was related more to dehydration.  pH was 7.34.  Patient had a minimal anion gap.  At this time however I think would be unlikely patient is truly in diabetic ketoacidosis.  After fluids his anion gap did close.  I do think likely patient had these abnormalities in his blood work secondary to the dehydration.  Lactic acid level did improve from 2.5 to 2.1.  Patient stated he felt better after fluids.  I did offer further fluids however patient felt comfortable and desired to go home.  I think this is reasonable.  Patient is Covid positive and I think this could explain the patient's symptoms.  Given elevated blood sugar and history of diabetes in the past will restart patient's Metformin.  I did discuss with patient importance of close primary  care follow-up.   ____________________________________________   FINAL CLINICAL IMPRESSION(S) / ED DIAGNOSES  Final diagnoses:  COVID-19  Dehydration  Weakness  Hyperglycemia     Note: This dictation was prepared with Dragon dictation. Any transcriptional errors that result from this process are unintentional     Nance Pear, MD 07/23/19 2257

## 2019-07-23 NOTE — Discharge Instructions (Addendum)
As we discussed it is important for you to have close follow up with Dr. Doy Hutching. Your blood sugars were elevated today and could get worse given your infection. Please return for any further high sugars, worsening weakness or concern for dehydration, high fevers, change in behavior or any other new or concerning symptoms.

## 2019-07-24 ENCOUNTER — Telehealth: Payer: Self-pay | Admitting: Physician Assistant

## 2019-07-24 NOTE — Telephone Encounter (Signed)
Called to discuss with John Stanton. about Covid symptoms and the use of bamlanivimab or casirivimab/imdevimab, a monoclonal antibody infusion for those with mild to moderate Covid symptoms and at a high risk of hospitalization. He qualifies due to his diabetes and rheumatoid arthritis.   Pt does not qualify for infusion therapy as pt's symptoms first presented > 10 days prior to timing of infusion. Symptoms tier reviewed as well as criteria for ending isolation. Preventative practices reviewed. Patient verbalized understanding   Angelena Form PA-C

## 2019-07-28 LAB — CULTURE, BLOOD (ROUTINE X 2)
Culture: NO GROWTH
Culture: NO GROWTH

## 2020-05-18 ENCOUNTER — Other Ambulatory Visit: Payer: Self-pay

## 2020-05-18 ENCOUNTER — Other Ambulatory Visit (HOSPITAL_COMMUNITY): Payer: Self-pay | Admitting: Internal Medicine

## 2020-05-18 ENCOUNTER — Ambulatory Visit
Admission: RE | Admit: 2020-05-18 | Discharge: 2020-05-18 | Disposition: A | Discharge status: Home / Self Care | Payer: Medicare Other | Source: Ambulatory Visit | Attending: Internal Medicine | Admitting: Internal Medicine

## 2020-05-18 ENCOUNTER — Other Ambulatory Visit: Payer: Self-pay | Admitting: Internal Medicine

## 2020-05-18 ENCOUNTER — Inpatient Hospital Stay
Admission: EM | Admit: 2020-05-18 | Discharge: 2020-05-21 | DRG: 342 | Disposition: A | Discharge status: Home / Self Care | Payer: Medicare Other | Attending: Surgery | Admitting: Surgery

## 2020-05-18 DIAGNOSIS — Z6837 Body mass index (BMI) 37.0-37.9, adult: Secondary | ICD-10-CM | POA: Diagnosis not present

## 2020-05-18 DIAGNOSIS — F419 Anxiety disorder, unspecified: Secondary | ICD-10-CM | POA: Diagnosis present

## 2020-05-18 DIAGNOSIS — Z20822 Contact with and (suspected) exposure to covid-19: Secondary | ICD-10-CM | POA: Diagnosis present

## 2020-05-18 DIAGNOSIS — E860 Dehydration: Secondary | ICD-10-CM | POA: Diagnosis present

## 2020-05-18 DIAGNOSIS — D84821 Immunodeficiency due to drugs: Secondary | ICD-10-CM | POA: Diagnosis present

## 2020-05-18 DIAGNOSIS — E119 Type 2 diabetes mellitus without complications: Secondary | ICD-10-CM | POA: Diagnosis present

## 2020-05-18 DIAGNOSIS — Z79899 Other long term (current) drug therapy: Secondary | ICD-10-CM

## 2020-05-18 DIAGNOSIS — Z8719 Personal history of other diseases of the digestive system: Secondary | ICD-10-CM | POA: Diagnosis not present

## 2020-05-18 DIAGNOSIS — E669 Obesity, unspecified: Secondary | ICD-10-CM | POA: Diagnosis present

## 2020-05-18 DIAGNOSIS — M069 Rheumatoid arthritis, unspecified: Secondary | ICD-10-CM | POA: Diagnosis present

## 2020-05-18 DIAGNOSIS — K3580 Unspecified acute appendicitis: Principal | ICD-10-CM | POA: Diagnosis present

## 2020-05-18 DIAGNOSIS — Z9049 Acquired absence of other specified parts of digestive tract: Secondary | ICD-10-CM

## 2020-05-18 DIAGNOSIS — R1031 Right lower quadrant pain: Secondary | ICD-10-CM

## 2020-05-18 DIAGNOSIS — N179 Acute kidney failure, unspecified: Secondary | ICD-10-CM | POA: Diagnosis present

## 2020-05-18 DIAGNOSIS — Z7984 Long term (current) use of oral hypoglycemic drugs: Secondary | ICD-10-CM | POA: Diagnosis not present

## 2020-05-18 DIAGNOSIS — Z888 Allergy status to other drugs, medicaments and biological substances status: Secondary | ICD-10-CM | POA: Diagnosis not present

## 2020-05-18 LAB — RESP PANEL BY RT-PCR (FLU A&B, COVID) ARPGX2
Influenza A by PCR: NEGATIVE
Influenza B by PCR: NEGATIVE
SARS Coronavirus 2 by RT PCR: NEGATIVE

## 2020-05-18 LAB — CBG MONITORING, ED: Glucose-Capillary: 86 mg/dL (ref 70–99)

## 2020-05-18 MED ORDER — LOSARTAN POTASSIUM 25 MG PO TABS
100.0000 mg | ORAL_TABLET | Freq: Every day | ORAL | Status: DC
  Administered 2020-05-19 – 2020-05-21 (×3): 100 mg via ORAL
  Filled 2020-05-18: qty 4
  Filled 2020-05-18: qty 2
  Filled 2020-05-18: qty 4
  Filled 2020-05-18: qty 2
  Filled 2020-05-18: qty 4

## 2020-05-18 MED ORDER — LACTATED RINGERS IV SOLN
125.0000 mL/h | INTRAVENOUS | Status: DC
  Administered 2020-05-18 – 2020-05-19 (×2): 125 mL/h via INTRAVENOUS

## 2020-05-18 MED ORDER — ALPRAZOLAM 0.5 MG PO TABS
0.5000 mg | ORAL_TABLET | Freq: Every evening | ORAL | Status: DC | PRN
  Administered 2020-05-20: 1 mg via ORAL

## 2020-05-18 MED ORDER — ONDANSETRON HCL 4 MG/2ML IJ SOLN: 4.0000 mg | Freq: Four times a day (QID) | INTRAMUSCULAR | Status: DC | PRN

## 2020-05-18 MED ORDER — PIPERACILLIN-TAZOBACTAM 3.375 G IVPB
3.3750 g | Freq: Three times a day (TID) | INTRAVENOUS | Status: DC
  Administered 2020-05-18 – 2020-05-20 (×4): 3.375 g via INTRAVENOUS
  Filled 2020-05-18 (×2): qty 50

## 2020-05-18 MED ORDER — PANTOPRAZOLE SODIUM 40 MG IV SOLR
40.0000 mg | Freq: Every day | INTRAVENOUS | Status: DC
  Administered 2020-05-19 – 2020-05-20 (×3): 40 mg via INTRAVENOUS
  Filled 2020-05-18 (×6): qty 40

## 2020-05-18 MED ORDER — ONDANSETRON 4 MG PO TBDP: 4.0000 mg | ORAL_TABLET | Freq: Four times a day (QID) | ORAL | Status: DC | PRN

## 2020-05-18 MED ORDER — IOHEXOL 300 MG/ML  SOLN
125.0000 mL | Freq: Once | INTRAMUSCULAR | Status: AC | PRN
  Administered 2020-05-18: 125 mL via INTRAVENOUS

## 2020-05-18 MED ORDER — BISOPROLOL-HYDROCHLOROTHIAZIDE 5-6.25 MG PO TABS
1.0000 | ORAL_TABLET | Freq: Every day | ORAL | Status: DC
  Administered 2020-05-19 – 2020-05-21 (×3): 1 via ORAL
  Filled 2020-05-18 (×4): qty 1

## 2020-05-18 MED ORDER — KETOROLAC TROMETHAMINE 30 MG/ML IJ SOLN
30.0000 mg | Freq: Four times a day (QID) | INTRAMUSCULAR | Status: DC
  Administered 2020-05-18 – 2020-05-19 (×3): 30 mg via INTRAVENOUS
  Filled 2020-05-18 (×3): qty 1

## 2020-05-18 MED ORDER — DULOXETINE HCL 60 MG PO CPEP
60.0000 mg | ORAL_CAPSULE | Freq: Every day | ORAL | Status: DC
  Administered 2020-05-19 – 2020-05-21 (×3): 60 mg via ORAL
  Filled 2020-05-18 (×3): qty 1

## 2020-05-18 MED ORDER — HYDROMORPHONE HCL 1 MG/ML IJ SOLN: 0.5000 mg | INTRAMUSCULAR | Status: DC | PRN

## 2020-05-18 MED ORDER — INSULIN ASPART 100 UNIT/ML ~~LOC~~ SOLN
0.0000 [IU] | SUBCUTANEOUS | Status: DC
  Administered 2020-05-19: 3 [IU] via SUBCUTANEOUS
  Filled 2020-05-18 (×2): qty 1

## 2020-05-18 NOTE — ED Notes (Signed)
EDP at bedside  

## 2020-05-18 NOTE — ED Notes (Signed)
Pt to ED c/o RLQ abdominal pain since yesterday. Vomited several times yesterday with green bile. Hx complicated gallbladder removal about 2 years ago. RLQ tender to light palpation.

## 2020-05-18 NOTE — ED Triage Notes (Signed)
Pt here for appendicitis. CT, bloodwork, IV already done by outpatient.

## 2020-05-18 NOTE — ED Provider Notes (Addendum)
[facility] Emergency Department Provider Note   ____________________________________________   First MD Initiated Contact with Patient 05/18/20 1848     (approximate)  I have reviewed the triage vital signs and the nursing notes.   HISTORY  Chief Complaint Abdominal Pain    HPI John Mccrone Pericles Carmicheal. is a 54 y.o. male who reports right lower quadrant pain starting last afternoon at 3:00. He had his tooth pulled just before that. He had an episode of feeling extremely cold last night as well. He had one other previous episode of that sometime previously not associated with this visit. Patient has a history of rheumatoid arthritis as well. He has diabetes. His right lower quadrant pain is a dull ache at about a 4 out of 10 but when he moves or someone pushes on his belly goes up to a 10 out of 10 and very sharp.         Past Medical History:  Diagnosis Date  . Anxiety   . Collagen vascular disease ([place])   . Diabetes mellitus without complication ([place])   . Multiple nasal polyps    required ENT surgery by Dr. Carlis Abbott in (approximately) 2016  . Rheumatoid arthritis [facility])     Patient Active Problem List   Diagnosis Date Noted  . Pancreatitis 06/30/2018    Past Surgical History:  Procedure Laterality Date  . CHOLECYSTECTOMY N/A 07/04/2018   Procedure: LAPAROSCOPIC CHOLECYSTECTOMY;  Surgeon: Benjamine Sprague, DO;  Location: [facility] ORS;  Service: General;  Laterality: N/A;  . FOOT SURGERY    . NASAL POLYP EXCISION    . WRIST SURGERY      Prior to Admission medications   Medication Sig Start Date End Date Taking? Authorizing Provider  ALPRAZolam Duanne Moron) 0.5 MG tablet Take 0.5-1 mg by mouth at bedtime as needed for sleep. for sleep 09/11/16   [provider]  bisoprolol-hydrochlorothiazide (ZIAC) 5-6.25 MG tablet Take 1 tablet by mouth daily. 06/24/18   [provider]  citalopram (CELEXA) 20 MG tablet Take 20 mg by mouth daily. 02/10/16    [provider]  DULoxetine (CYMBALTA) 30 MG capsule Take 60 mg by mouth daily. 04/08/18   [provider]  metFORMIN (GLUCOPHAGE) 500 MG tablet Take 1 tablet (500 mg total) by mouth 2 (two) times daily with a meal. 07/23/19 07/22/20  Nance Pear, MD  ondansetron (ZOFRAN) 4 MG tablet Take 1 tablet (4 mg total) by mouth every 8 (eight) hours as needed. 07/23/19   Nance Pear, MD  oxyCODONE-acetaminophen (PERCOCET) 5-325 MG tablet Take 1 tablet by mouth every 6 (six) hours as needed for moderate pain or severe pain. 07/05/18   Salary, Avel Peace, MD  pravastatin (PRAVACHOL) 40 MG tablet Take 40 mg by mouth daily. 08/06/16   [provider]  tiZANidine (ZANAFLEX) 4 MG tablet Take 1 tablet by mouth at bedtime as needed. 06/17/18   [provider]  Tofacitinib Citrate (XELJANZ) 5 MG TABS Take 5 mg by mouth 2 (two) times daily.  07/28/16   [provider]    Allergies Infliximab, Methotrexate, Venlafaxine, and Prednisone  History reviewed. No pertinent family history.  Social History Social History   Tobacco Use  . Smoking status: Never Smoker  . Smokeless tobacco: Never Used  Vaping Use  . Vaping Use: Never used  Substance Use Topics  . Alcohol use: No  . Drug use: No    Review of Systems  Constitutional: No fever/chills Eyes: No visual changes. ENT: No  sore throat. Cardiovascular: Denies chest pain. Respiratory: Denies shortness of breath. Gastrointestinal:  abdominal pain.  No nausea, no vomiting.  No diarrhea.  No constipation. Genitourinary: Negative for dysuria. Musculoskeletal: Negative for back pain. Skin: Negative for rash. Neurological: Negative for headaches, focal weakness   ____________________________________________   PHYSICAL EXAM:  VITAL SIGNS: ED Triage Vitals [05/18/20 1825]  Enc Vitals Group     BP 134/78     Pulse Rate 92     Resp 18     Temp 99.2 F (37.3 C)     Temp Source Oral     SpO2 99 %      Weight 261 lb (118.4 kg)     Height 5\' 10"  (1.778 m)     Head Circumference      Peak Flow      Pain Score 7     Pain Loc      Pain Edu?      Excl. in [place]?     Constitutional: Alert and oriented. Well appearing and in no acute distress. Eyes: Conjunctivae are normal. PER Head: Atraumatic. Nose: No congestion/rhinnorhea. Mouth/Throat: Mucous membranes are moist.  Oropharynx non-erythematous. Neck: No stridor.   Cardiovascular: Normal rate, regular rhythm. Grossly normal heart sounds.  Good peripheral circulation. Respiratory: Normal respiratory effort.  No retractions. Lungs CTAB. Gastrointestinal: Soft and nontender except to palpation in the right lower quadrant.. No distention. No abdominal bruits.  Musculoskeletal: No lower extremity tenderness nor edema.   Neurologic:  Normal speech and language. No gross focal neurologic deficits are appreciated.  Skin:  Skin is warm, dry and intact. No rash noted.   ____________________________________________   LABS (all labs ordered are listed, but only abnormal results are displayed)  Labs Reviewed  RESP PANEL BY RT-PCR (FLU A&B, COVID) [id number]   ____________________________________________  EKG  EKG read interpreted by me shows normal sinus rhythm rate of 84 normal axis no acute ST-T wave changes ____________________________________________  RADIOLOGY Gertha Calkin, personally viewed and evaluated these images (plain radiographs) as part of my medical decision making, as well as reviewing the written report by the radiologist.    Official radiology report(s): CT ABDOMEN PELVIS W CONTRAST  Result Date: 05/18/2020 CLINICAL DATA:  28-year-old male with right lower quadrant abdominal pain for 2 days. EXAM: CT ABDOMEN AND PELVIS WITH CONTRAST TECHNIQUE: Multidetector CT imaging of the abdomen and pelvis was performed using the standard protocol following bolus administration of intravenous contrast. CONTRAST:  11mL OMNIPAQUE  IOHEXOL 300 MG/ML  SOLN COMPARISON:  CT Abdomen and Pelvis 06/30/2018. FINDINGS: Lower chest: Negative. Hepatobiliary: Absent gallbladder now. Possible mild hepatic steatosis. Small surgical clip in Morrison's pouch. Pancreas: Negative.  Resolved Peri appendiceal inflammation. Spleen: Negative. Adrenals/Urinary Tract: Normal adrenal glands. Bilateral renal enhancement and contrast excretion is symmetric and normal. Small right renal lower pole benign-appearing cyst is stable. Negative ureters and bladder. Stomach/Bowel: Negative large bowel from the splenic flexure distally. Mild to moderate diverticulosis at the hepatic flexure but no active inflammation. Negative ascending colon. Appendix: Location: Inferior to the cecum directed caudally (coronal image 56) Diameter: 10 mm, abnormal Appendicolith: None identified Mucosal hyper-enhancement: Positive Extraluminal gas: Negative Periappendiceal collection: Confluent Peri appendiceal inflammatory stranding. Trace if any fluid and no organized fluid collection. Terminal ileum is spared and within normal limits. No dilated small bowel. Oral contrast has reached the ascending colon. Decompressed stomach. Small duodenal diverticulum with no adverse features (series 2, image 38). No free air. Vascular/Lymphatic: Major arterial structures are patent.  Mild Aortoiliac calcified atherosclerosis. Portal venous system is patent. No lymphadenopathy. Reproductive: Possible small fat containing inguinal hernias stable. Questionable superimposed chronic right femoral hernia also mostly containing fat (series 2, image 92). Other: No pelvic free fluid. Musculoskeletal: Stable lower lumbar spine degeneration. No acute osseous abnormality identified. IMPRESSION: 1. Positive for Acute Appendicitis. No perforation, abscess, or other complicating features. 2. Cholecystectomy since last year.  Mild diverticulosis. Electronically Signed   By: Genevie Ann M.D.   On: 05/18/2020 18:04     ____________________________________________   PROCEDURES  Procedure(s) performed (including Critical Care):  Procedures   ____________________________________________   INITIAL IMPRESSION / ASSESSMENT AND PLAN / ED COURSE  CT done prior to his ER visit shows an acute appendicitis. I discussed him with the surgeon who will put in admission orders. White blood count is normal total bili slightly elevated.              ____________________________________________   FINAL CLINICAL IMPRESSION(S) / ED DIAGNOSES  Final diagnoses:  Acute appendicitis, unspecified acute appendicitis type     ED Discharge Orders    None      *Please note:  John Stanton. was evaluated in Emergency Department on 05/18/2020 for the symptoms described in the history of present illness. He was evaluated in the context of the global COVID-19 pandemic, which necessitated consideration that the patient might be at risk for infection with the SARS-CoV-2 virus that causes COVID-19. Institutional protocols and algorithms that pertain to the evaluation of patients at risk for COVID-19 are in a state of rapid change based on information released by regulatory bodies including the CDC and federal and state organizations. These policies and algorithms were followed during the patient's care in the ED.  Some ED evaluations and interventions may be delayed as a result of limited staffing during and the pandemic.*   Note:  This document was prepared using Dragon voice recognition software and may include unintentional dictation errors.    Nena Polio, MD 05/18/20 1900    Nena Polio, MD 05/18/20 2043

## 2020-05-18 NOTE — ED Notes (Signed)
12 lead EKG obtained per verbal order.

## 2020-05-19 ENCOUNTER — Encounter: Payer: Self-pay | Admitting: Surgery

## 2020-05-19 ENCOUNTER — Inpatient Hospital Stay: Payer: Medicare Other | Admitting: Anesthesiology

## 2020-05-19 ENCOUNTER — Encounter
Admission: EM | Disposition: A | Discharge status: Home / Self Care | Payer: Self-pay | Source: Home / Self Care | Attending: Surgery

## 2020-05-19 DIAGNOSIS — K3580 Unspecified acute appendicitis: Principal | ICD-10-CM

## 2020-05-19 HISTORY — PX: LAPAROSCOPIC APPENDECTOMY: SHX408

## 2020-05-19 LAB — GLUCOSE, CAPILLARY
Glucose-Capillary: 123 mg/dL — ABNORMAL HIGH (ref 70–99)
Glucose-Capillary: 99 mg/dL (ref 70–99)
Glucose-Capillary: 131 mg/dL — ABNORMAL HIGH (ref 70–99)
Glucose-Capillary: 109 mg/dL — ABNORMAL HIGH (ref 70–99)
Glucose-Capillary: 158 mg/dL — ABNORMAL HIGH (ref 70–99)
Glucose-Capillary: 125 mg/dL — ABNORMAL HIGH (ref 70–99)

## 2020-05-19 LAB — MAGNESIUM: Magnesium: 2.1 mg/dL (ref 1.7–2.4)

## 2020-05-19 LAB — CBC
HCT: 33.5 % — ABNORMAL LOW (ref 39.0–52.0)
Hemoglobin: 11.3 g/dL — ABNORMAL LOW (ref 13.0–17.0)
MCH: 27.1 pg (ref 26.0–34.0)
MCHC: 33.7 g/dL (ref 30.0–36.0)
MCV: 80.3 fL (ref 80.0–100.0)
Platelets: 184 10*3/uL (ref 150–400)
RBC: 4.17 MIL/uL — ABNORMAL LOW (ref 4.22–5.81)
RDW: 14.7 % (ref 11.5–15.5)
WBC: 9 10*3/uL (ref 4.0–10.5)
nRBC: 0 % (ref 0.0–0.2)

## 2020-05-19 LAB — BASIC METABOLIC PANEL
Anion gap: 8 (ref 5–15)
BUN: 19 mg/dL (ref 6–20)
CO2: 27 mmol/L (ref 22–32)
Calcium: 8.5 mg/dL — ABNORMAL LOW (ref 8.9–10.3)
Chloride: 101 mmol/L (ref 98–111)
Creatinine, Ser: 1.32 mg/dL — ABNORMAL HIGH (ref 0.61–1.24)
GFR, Estimated: >60 mL/min (ref >60)
Glucose, Bld: 118 mg/dL — ABNORMAL HIGH (ref 70–99)
Potassium: 3.2 mmol/L — ABNORMAL LOW (ref 3.5–5.1)
Sodium: 136 mmol/L (ref 135–145)

## 2020-05-19 LAB — HEMOGLOBIN A1C
Hgb A1c MFr Bld: 5.7 % — ABNORMAL HIGH (ref 4.8–5.6)
Mean Plasma Glucose: 116.89 mg/dL

## 2020-05-19 SURGERY — APPENDECTOMY, LAPAROSCOPIC
Anesthesia: General

## 2020-05-19 MED ORDER — PIPERACILLIN-TAZOBACTAM 3.375 G IVPB
INTRAVENOUS | Status: AC
  Administered 2020-05-19: 3.375 g via INTRAVENOUS
  Filled 2020-05-19: qty 50

## 2020-05-19 MED ORDER — SUCCINYLCHOLINE CHLORIDE 20 MG/ML IJ SOLN
INTRAMUSCULAR | Status: DC | PRN
  Administered 2020-05-19: 160 mg via INTRAVENOUS

## 2020-05-19 MED ORDER — KETOROLAC TROMETHAMINE 15 MG/ML IJ SOLN
15.0000 mg | Freq: Four times a day (QID) | INTRAMUSCULAR | Status: DC | PRN
  Filled 2020-05-19: qty 1

## 2020-05-19 MED ORDER — SODIUM CHLORIDE 0.9 % IV SOLN: INTRAVENOUS | Status: DC | PRN

## 2020-05-19 MED ORDER — PROPOFOL 10 MG/ML IV BOLUS
INTRAVENOUS | Status: AC
  Filled 2020-05-19: qty 20

## 2020-05-19 MED ORDER — OXYCODONE HCL 5 MG PO TABS: 5.0000 mg | ORAL_TABLET | ORAL | Status: DC | PRN

## 2020-05-19 MED ORDER — PROPOFOL 10 MG/ML IV BOLUS
INTRAVENOUS | Status: DC | PRN
  Administered 2020-05-19: 200 mg via INTRAVENOUS

## 2020-05-19 MED ORDER — MIDAZOLAM HCL 2 MG/2ML IJ SOLN
INTRAMUSCULAR | Status: AC
  Filled 2020-05-19: qty 2

## 2020-05-19 MED ORDER — INSULIN ASPART 100 UNIT/ML ~~LOC~~ SOLN
SUBCUTANEOUS | Status: AC
  Administered 2020-05-19: 3 [IU] via SUBCUTANEOUS
  Filled 2020-05-19: qty 1

## 2020-05-19 MED ORDER — FENTANYL CITRATE (PF) 100 MCG/2ML IJ SOLN: 25.0000 ug | INTRAMUSCULAR | Status: DC | PRN

## 2020-05-19 MED ORDER — ONDANSETRON HCL 4 MG/2ML IJ SOLN
INTRAMUSCULAR | Status: AC
  Filled 2020-05-19: qty 2

## 2020-05-19 MED ORDER — LIDOCAINE HCL (CARDIAC) PF 100 MG/5ML IV SOSY
PREFILLED_SYRINGE | INTRAVENOUS | Status: DC | PRN
  Administered 2020-05-19: 100 mg via INTRAVENOUS

## 2020-05-19 MED ORDER — POTASSIUM CHLORIDE 10 MEQ/100ML IV SOLN
10.0000 meq | INTRAVENOUS | Status: AC
  Administered 2020-05-19 (×2): 10 meq via INTRAVENOUS
  Filled 2020-05-19 (×4): qty 100

## 2020-05-19 MED ORDER — POTASSIUM CHLORIDE 10 MEQ/100ML IV SOLN
10.0000 meq | INTRAVENOUS | Status: AC
  Administered 2020-05-19 (×2): 10 meq via INTRAVENOUS
  Filled 2020-05-19 (×3): qty 100

## 2020-05-19 MED ORDER — MIDAZOLAM HCL 2 MG/2ML IJ SOLN
INTRAMUSCULAR | Status: DC | PRN
  Administered 2020-05-19: 2 mg via INTRAVENOUS

## 2020-05-19 MED ORDER — POTASSIUM CHLORIDE IN NACL 20-0.9 MEQ/L-% IV SOLN
INTRAVENOUS | Status: DC
  Filled 2020-05-19 (×8): qty 1000

## 2020-05-19 MED ORDER — ROCURONIUM BROMIDE 100 MG/10ML IV SOLN
INTRAVENOUS | Status: DC | PRN
  Administered 2020-05-19: 20 mg via INTRAVENOUS
  Administered 2020-05-19: 50 mg via INTRAVENOUS

## 2020-05-19 MED ORDER — BUPIVACAINE-EPINEPHRINE (PF) 0.5% -1:200000 IJ SOLN
INTRAMUSCULAR | Status: DC | PRN
  Administered 2020-05-19: 30 mL

## 2020-05-19 MED ORDER — ACETAMINOPHEN 500 MG PO TABS: 1000.0000 mg | ORAL_TABLET | Freq: Four times a day (QID) | ORAL | Status: DC | PRN

## 2020-05-19 MED ORDER — INSULIN ASPART 100 UNIT/ML ~~LOC~~ SOLN: 0.0000 [IU] | Freq: Three times a day (TID) | SUBCUTANEOUS | Status: DC

## 2020-05-19 MED ORDER — SUGAMMADEX SODIUM 500 MG/5ML IV SOLN
INTRAVENOUS | Status: DC | PRN
  Administered 2020-05-19: 200 mg via INTRAVENOUS

## 2020-05-19 MED ORDER — FENTANYL CITRATE (PF) 100 MCG/2ML IJ SOLN
INTRAMUSCULAR | Status: DC | PRN
  Administered 2020-05-19 (×2): 50 ug via INTRAVENOUS

## 2020-05-19 MED ORDER — TAMSULOSIN HCL 0.4 MG PO CAPS
0.4000 mg | ORAL_CAPSULE | Freq: Every day | ORAL | Status: DC
  Administered 2020-05-20 – 2020-05-21 (×2): 0.4 mg via ORAL
  Filled 2020-05-19 (×3): qty 1

## 2020-05-19 MED ORDER — ONDANSETRON HCL 4 MG/2ML IJ SOLN
INTRAMUSCULAR | Status: DC | PRN
  Administered 2020-05-19: 4 mg via INTRAVENOUS

## 2020-05-19 MED ORDER — PHENYLEPHRINE HCL (PRESSORS) 10 MG/ML IV SOLN
INTRAVENOUS | Status: DC | PRN
  Administered 2020-05-19: 100 ug via INTRAVENOUS

## 2020-05-19 MED ORDER — FENTANYL CITRATE (PF) 100 MCG/2ML IJ SOLN
INTRAMUSCULAR | Status: AC
  Filled 2020-05-19: qty 2

## 2020-05-19 MED ORDER — PROMETHAZINE HCL 25 MG/ML IJ SOLN: 6.2500 mg | INTRAMUSCULAR | Status: DC | PRN

## 2020-05-19 MED ORDER — DEXAMETHASONE SODIUM PHOSPHATE 10 MG/ML IJ SOLN
INTRAMUSCULAR | Status: AC
  Filled 2020-05-19: qty 1

## 2020-05-19 SURGICAL SUPPLY — 42 items
CANISTER SUCT 1200ML W/VALVE (MISCELLANEOUS) ×3 IMPLANT
CHLORAPREP W/TINT 26 (MISCELLANEOUS) ×3 IMPLANT
COVER WAND RF STERILE (DRAPES) ×3 IMPLANT
CUTTER FLEX LINEAR 45M (STAPLE) ×3 IMPLANT
DERMABOND ADVANCED (GAUZE/BANDAGES/DRESSINGS) ×2
DERMABOND ADVANCED .7 DNX12 (GAUZE/BANDAGES/DRESSINGS) ×1 IMPLANT
ELECT CAUTERY BLADE 6.4 (BLADE) ×3 IMPLANT
ELECT REM PT RETURN 9FT ADLT (ELECTROSURGICAL) ×3
ELECTRODE REM PT RTRN 9FT ADLT (ELECTROSURGICAL) ×1 IMPLANT
GLOVE SURG SYN 7.0 (GLOVE) ×3 IMPLANT
GLOVE SURG SYN 7.5  E (GLOVE) ×2
GLOVE SURG SYN 7.5 E (GLOVE) ×1 IMPLANT
GOWN STRL REUS W/ TWL LRG LVL3 (GOWN DISPOSABLE) ×2 IMPLANT
GOWN STRL REUS W/TWL LRG LVL3 (GOWN DISPOSABLE) ×4
IRRIGATION STRYKERFLOW (MISCELLANEOUS) IMPLANT
IRRIGATOR STRYKERFLOW (MISCELLANEOUS)
IV NS 1000ML (IV SOLUTION) ×2
IV NS 1000ML BAXH (IV SOLUTION) ×1 IMPLANT
KIT TURNOVER KIT A (KITS) ×3 IMPLANT
LABEL OR SOLS (LABEL) ×3 IMPLANT
LIGASURE LAP MARYLAND 5MM 37CM (ELECTROSURGICAL) ×3 IMPLANT
MANIFOLD NEPTUNE II (INSTRUMENTS) ×3 IMPLANT
NEEDLE HYPO 22GX1.5 SAFETY (NEEDLE) ×3 IMPLANT
NS IRRIG 500ML POUR BTL (IV SOLUTION) ×3 IMPLANT
PACK LAP CHOLECYSTECTOMY (MISCELLANEOUS) ×3 IMPLANT
PENCIL ELECTRO HAND CTR (MISCELLANEOUS) ×3 IMPLANT
POUCH SPECIMEN RETRIEVAL 10MM (ENDOMECHANICALS) ×3 IMPLANT
RELOAD 45 VASCULAR/THIN (ENDOMECHANICALS) IMPLANT
RELOAD STAPLE TA45 3.5 REG BLU (ENDOMECHANICALS) ×6 IMPLANT
SCISSORS METZENBAUM CVD 33 (INSTRUMENTS) ×3 IMPLANT
SLEEVE ADV FIXATION 5X100MM (TROCAR) ×3 IMPLANT
SUT MNCRL 4-0 (SUTURE) ×2
SUT MNCRL 4-0 27XMFL (SUTURE) ×1
SUT VIC AB 3-0 SH 27 (SUTURE) ×4
SUT VIC AB 3-0 SH 27X BRD (SUTURE) ×2 IMPLANT
SUT VICRYL 0 AB UR-6 (SUTURE) ×3 IMPLANT
SUTURE MNCRL 4-0 27XMF (SUTURE) ×1 IMPLANT
SYS KII FIOS ACCESS ABD 5X100 (TROCAR) ×3
SYSTEM KII FIOS ACES ABD 5X100 (TROCAR) ×1 IMPLANT
TRAY FOLEY MTR SLVR 16FR STAT (SET/KITS/TRAYS/PACK) ×3 IMPLANT
TROCAR BALLN GELPORT 12X130M (ENDOMECHANICALS) ×3 IMPLANT
TUBING EVAC SMOKE HEATED PNEUM (TUBING) ×3 IMPLANT

## 2020-05-19 NOTE — Anesthesia Postprocedure Evaluation (Signed)
Anesthesia Post Note  Patient: John Stanton.  Procedure(s) Performed: APPENDECTOMY LAPAROSCOPIC (N/A )  Patient location during evaluation: PACU Anesthesia Type: General Level of consciousness: awake and alert Pain management: pain level controlled Vital Signs Assessment: post-procedure vital signs reviewed and stable Respiratory status: spontaneous breathing, nonlabored ventilation, respiratory function stable and patient connected to nasal cannula oxygen Cardiovascular status: blood pressure returned to baseline and stable Postop Assessment: no apparent nausea or vomiting Anesthetic complications: no   No complications documented.   Last Vitals:  Vitals:   05/19/20 1626 05/19/20 1716  BP: 113/63 116/60  Pulse: 84 80  Resp: 17 16  Temp:    SpO2: 92% 97%    Last Pain:  Vitals:   05/19/20 1625  TempSrc:   PainSc: 0-No pain                 Precious Haws Zamiya Dillard

## 2020-05-19 NOTE — ED Notes (Addendum)
Report given to Sam, PO RN

## 2020-05-19 NOTE — Progress Notes (Signed)
4 bags of potassium 10 meq in 142ml total has been given along with mainteince fluids NS with 69meq KCl running at 125ml/hr

## 2020-05-19 NOTE — Anesthesia Preprocedure Evaluation (Signed)
Anesthesia Evaluation  Patient identified by MRN, date of birth, ID band Patient awake    Reviewed: Allergy & Precautions, H&P , NPO status , Patient's Chart, lab work & pertinent test results, reviewed documented beta blocker date and time   History of Anesthesia Complications Negative for: history of anesthetic complications  Airway Mallampati: II  TM Distance: >3 FB Neck ROM: full    Dental  (+) Dental Advidsory Given, Missing, Poor Dentition   Pulmonary neg pulmonary ROS,    Pulmonary exam normal breath sounds clear to auscultation       Cardiovascular Exercise Tolerance: Good hypertension, (-) angina(-) Past MI and (-) Cardiac Stents Normal cardiovascular exam(-) dysrhythmias (-) Valvular Problems/Murmurs Rhythm:regular Rate:Normal     Neuro/Psych negative neurological ROS  negative psych ROS   GI/Hepatic negative GI ROS, Neg liver ROS,   Endo/Other  diabetes  Renal/GU negative Renal ROS  negative genitourinary   Musculoskeletal   Abdominal   Peds  Hematology negative hematology ROS (+)   Anesthesia Other Findings Past Medical History: No date: Anxiety No date: Collagen vascular disease ([facility]) No date: Diabetes mellitus without complication ([facility]) No date: Multiple nasal polyps     Comment:  required ENT surgery by Dr. Carlis Abbott in (approximately)               2016 No date: Rheumatoid arthritis ([place])   Reproductive/Obstetrics negative OB ROS                             Anesthesia Physical Anesthesia Plan  ASA: II  Anesthesia Plan: General   Post-op Pain Management:    Induction: Intravenous  PONV Risk Score and Plan: 2 and Ondansetron, Dexamethasone, Midazolam and Treatment may vary due to age or medical condition  Airway Management Planned: Oral ETT  Additional Equipment:   Intra-op Plan:   Post-operative Plan: Extubation in OR  Informed Consent: I have reviewed the  patients History and Physical, chart, labs and discussed the procedure including the risks, benefits and alternatives for the proposed anesthesia with the patient or authorized representative who has indicated his/her understanding and acceptance.     Dental Advisory Given  Plan Discussed with: Anesthesiologist, CRNA and Surgeon  Anesthesia Plan Comments:         Anesthesia Quick Evaluation

## 2020-05-19 NOTE — Op Note (Signed)
  Procedure Date:  05/19/2020  Pre-operative Diagnosis:  Acute appendicitis  Post-operative Diagnosis:  Acute appendicitis  Procedure:  Laparoscopic appendectomy, excision of prior umbilical scar including skin and subcutaneous tissue for 3 cm.  Surgeon:  Melvyn Neth, MD  Anesthesia:  General endotracheal  Estimated Blood Loss:  10 ml  Specimens:  appendix  Complications:  None  Indications for Procedure:  This is a 54 y.o. male who presents with abdominal pain and workup revealing acute appendicitis.  The options of surgery versus observation were reviewed with the patient and/or family. The risks of bleeding, infection, recurrence of symptoms, negative laparoscopy, potential for an open procedure, bowel injury, abscess or infection, were all discussed with the patient and he was willing to proceed.  Description of Procedure: The patient was correctly identified in the preoperative area and brought into the operating room.  The patient was placed supine with VTE prophylaxis in place.  Appropriate time-outs were performed.  Anesthesia was induced and the patient was intubated.  Appropriate antibiotics were infused.  Foley catheter was placed.  There was some resistance while placing the catheter, and after turning of the catheter, it appeared to go in with ease.  Balloon was inflated but there was no urine output.    The abdomen was prepped and draped in a sterile fashion. An elliptical infraumbilical incision was made incorporating the patient's previous scar and subcutaneous tissue. A cutdown technique was used to enter the abdominal cavity without injury, and a Hasson trocar was inserted.  Pneumoperitoneum was obtained with appropriate opening pressures.  Two 5-mm ports were placed in the suprapubic and left lateral positions under direct visualization.  The right lower quadrant was inspected and the appendix was identified and found to be acutely inflamed but not perforated.  There  was no fluid in the pelvis.  The appendix was carefully dissected.  The mesoappendix was divided using the LigaSure.  The base of the appendix was dissected out and divided with a standard load Endo GIA.  The appendix was placed in an Endocatch bag.  The right lower quadrant was then inspected again revealing an intact staple line, no bleeding, and no bowel injury.  The area was thoroughly irrigated.  The 5 mm ports were removed under direct visualization and the Hasson trocar was removed.  The Endocatch bag was brought out through the umbilical incision.  The fascial opening was closed using 0 vicryl suture.  Local anesthetic was infused in all incisions and the incisions were closed with 3-0 Vicryl and 4-0 Monocryl.  The wounds were cleaned and sealed with DermaBond.  Foley catheter was removed and the patient was emerged from anesthesia and extubated and brought to the recovery room for further management.  The patient tolerated the procedure well and all counts were correct at the end of the case.   Melvyn Neth, MD

## 2020-05-19 NOTE — H&P (Signed)
[facility] SURGICAL HISTORY & PHYSICAL (cpt [id number])  HISTORY OF PRESENT ILLNESS (HPI):  54 y.o. male presented to [facility] ED yesterday for abdominal pain. Patient initially presented to his PCP office yesterday (12/07) with complaints of RLQ abdominal pain over the course of the previous 48 hours. This was localized in his RLQ, described as sharp, and exacerbated with movements. He noted some associated nausea and emesis with the abdominal pain. He denied any history of similar pain in the past. Only previous intra-abdominal surgery is laparoscopic cholecystectomy in 2020 with Dr Lysle Pearl. Work up from his PCP office revealed normal WBC at 10.2K and was otherwise unremarkable. He did undergo CT Abdomen/Pelvis which was concerning for acute uncomplicated appendicitis. He then presented to the ED for evaluation and admission.    General surgery is consulted by emergency medicine physician Dr Conni Slipper, MD for evaluation and management of acute appendicitis.   PAST MEDICAL HISTORY (PMH):  Past Medical History:  Diagnosis Date  . Anxiety   . Collagen vascular disease ([place])   . Diabetes mellitus without complication ([place])   . Multiple nasal polyps    required ENT surgery by Dr. Carlis Abbott in (approximately) 2016  . Rheumatoid arthritis ([place])     Reviewed. Otherwise negative.   PAST SURGICAL HISTORY ([place]):  Past Surgical History:  Procedure Laterality Date  . CHOLECYSTECTOMY N/A 07/04/2018   Procedure: LAPAROSCOPIC CHOLECYSTECTOMY;  Surgeon: Benjamine Sprague, DO;  Location: [facility] ORS;  Service: General;  Laterality: N/A;  . FOOT SURGERY    . NASAL POLYP EXCISION    . WRIST SURGERY      Reviewed. Otherwise negative.   MEDICATIONS:  Prior to Admission medications   Medication Sig Start Date End Date Taking? Authorizing Provider  ALPRAZolam Duanne Moron) 0.5 MG tablet Take 0.5-1 mg by mouth at bedtime as needed for sleep. for sleep 09/11/16  Yes [provider]   bisoprolol-hydrochlorothiazide (ZIAC) 5-6.25 MG tablet Take 1 tablet by mouth daily. 06/24/18  Yes [provider]  DULoxetine (CYMBALTA) 60 MG capsule Take 60 mg by mouth daily. 04/28/20  Yes [provider]  losartan (COZAAR) 100 MG tablet Take 100 mg by mouth daily. 05/07/20  Yes [provider]  metFORMIN (GLUCOPHAGE) 500 MG tablet Take 1 tablet (500 mg total) by mouth 2 (two) times daily with a meal. 07/23/19 07/22/20 Yes Nance Pear, MD  pravastatin (PRAVACHOL) 40 MG tablet Take 40 mg by mouth daily. 08/06/16  Yes [provider]  Tofacitinib Citrate (XELJANZ) 5 MG TABS Take 5 mg by mouth 2 (two) times daily.   Yes [provider]     ALLERGIES:  Allergies  Allergen Reactions  . Gabapentin Other (See Comments)  . Glipizide Other (See Comments) and Nausea Only  . Infliximab Other (See Comments)  . Methotrexate Other (See Comments)  . Venlafaxine Other (See Comments)    ELEVATED BP  . Prednisone Rash     SOCIAL HISTORY:  Social History   Socioeconomic History  . Marital status: Married    Spouse name: Not on file  . Number of children: Not on file  . Years of education: Not on file  . Highest education level: Not on file  Occupational History  . Not on file  Tobacco Use  . Smoking status: Never Smoker  . Smokeless tobacco: Never Used  Vaping Use  . Vaping Use: Never used  Substance and Sexual Activity  . Alcohol use: No  . Drug use: No  . Sexual activity: Not  on file  Other Topics Concern  . Not on file  Social History Narrative  . Not on file   Social Determinants of Health   Financial Resource Strain:   . Difficulty of Paying Living Expenses: Not on file  Food Insecurity:   . Worried About Charity fundraiser in the Last Year: Not on file  . Ran Out of Food in the Last Year: Not on file  Transportation Needs:   . Lack of Transportation (Medical): Not on file  . Lack of Transportation (Non-Medical): Not on file   Physical Activity:   . Days of Exercise per Week: Not on file  . Minutes of Exercise per Session: Not on file  Stress:   . Feeling of Stress : Not on file  Social Connections:   . Frequency of Communication with Friends and Family: Not on file  . Frequency of Social Gatherings with Friends and Family: Not on file  . Attends Religious Services: Not on file  . Active Member of Clubs or Organizations: Not on file  . Attends Archivist Meetings: Not on file  . Marital Status: Not on file  Intimate Partner Violence:   . Fear of Current or Ex-Partner: Not on file  . Emotionally Abused: Not on file  . Physically Abused: Not on file  . Sexually Abused: Not on file     FAMILY HISTORY:  History reviewed. No pertinent family history.  Otherwise negative.   REVIEW OF SYSTEMS:  Review of Systems  Constitutional: Negative for chills and fever.  HENT: Negative for congestion and sore throat.   Respiratory: Negative for cough and shortness of breath.   Cardiovascular: Negative for chest pain and palpitations.  Gastrointestinal: Positive for abdominal pain, nausea and vomiting. Negative for blood in stool, constipation and diarrhea.  Genitourinary: Negative for dysuria and urgency.  All other systems reviewed and are negative.   VITAL SIGNS:  Temp:  [99.2 F (37.3 C)] 99.2 F (37.3 C) (12/07 1825) Pulse Rate:  [73-92] 86 (12/08 0700) Resp:  [13-21] 17 (12/08 0700) BP: (124-137)/(61-78) 137/70 (12/08 0700) SpO2:  [95 %-100 %] 97 % (12/08 0700) Weight:  [118.4 kg] 118.4 kg (12/07 1825)     Height: 5\' 10"  (177.8 cm) Weight: 118.4 kg BMI (Calculated): 37.45   PHYSICAL EXAM:  Physical Exam Vitals and nursing note reviewed.  Constitutional:      General: He is not in acute distress.    Appearance: He is well-developed. He is obese. He is not ill-appearing.  HENT:     Head: Normocephalic and atraumatic.  Eyes:     General: No scleral icterus.    Extraocular Movements:  Extraocular movements intact.  Cardiovascular:     Rate and Rhythm: Normal rate and regular rhythm.     Heart sounds: Normal heart sounds. No murmur heard.   Pulmonary:     Effort: Pulmonary effort is normal. No respiratory distress.     Breath sounds: Normal breath sounds.  Abdominal:     General: Abdomen is flat. A surgical scar is present. There is no distension.     Palpations: Abdomen is soft.     Tenderness: There is abdominal tenderness in the right lower quadrant. There is no guarding or rebound. Positive signs include McBurney's sign.  Genitourinary:    Comments: Deferred Skin:    General: Skin is warm and dry.     Coloration: Skin is not jaundiced or pale.  Neurological:     General: No focal  deficit present.     Mental Status: He is alert and oriented to person, place, and time.  Psychiatric:        Mood and Affect: Mood normal.        Behavior: Behavior normal.     INTAKE/OUTPUT:  This shift: No intake/output data recorded.  Last 2 shifts: @IOLAST2SHIFTS @  Labs:  CBC Latest Ref Rng & Units 05/19/2020 07/23/2019 07/05/2018  WBC 4.0 - 10.5 K/uL 9.0 3.4(L) 8.6  Hemoglobin 13.0 - 17.0 g/dL 11.3(L) 16.7 12.6(L)  Hematocrit 39 - 52 % 33.5(L) 47.2 38.5(L)  Platelets 150 - 400 K/uL 184 160 237   CMP Latest Ref Rng & Units 05/19/2020 07/23/2019 07/23/2019  Glucose 70 - 99 mg/dL 118(H) 283(H) 396(H)  BUN 6 - 20 mg/dL 19 13 15   Creatinine 0.61 - 1.24 mg/dL 1.32(H) 0.99 1.10  Sodium 135 - 145 mmol/L 136 136 131(L)  Potassium 3.5 - 5.1 mmol/L 3.2(L) 3.3(L) 3.8  Chloride 98 - 111 mmol/L 101 102 95(L)  CO2 22 - 32 mmol/L 27 24 20(L)  Calcium 8.9 - 10.3 mg/dL 8.5(L) 7.5(L) 8.8(L)  Total Protein 6.5 - 8.1 g/dL - - 7.8  Total Bilirubin 0.3 - 1.2 mg/dL - - 1.9(H)  Alkaline Phos 38 - 126 U/L - - 121  AST 15 - 41 U/L - - 52(H)  ALT 0 - 44 U/L - - 64(H)    Imaging studies:   CT Abdomen/Pelvis (05/18/2020) personally reviewed showing stranding and inflammation surrounding the  appendix without air or fluid, and radiologist report reviewed:  IMPRESSION: 1. Positive for Acute Appendicitis. No perforation, abscess, or other complicating features.  2. Cholecystectomy since last year.  Mild diverticulosis.    Assessment/Plan: (ICD-10's: K35.80) 54 y.o. male with RLQ abdominal pain and acute uncomplicated appendicitis.    - We will admit to general surgery service  - Will plan on laparoscopic appendectomy this afternoon with Dr Hampton Abbot pending OR/Anesthesia availability  - All risks, benefits, and alternatives to above procedure(s) were discussed with the patient, all of his questions were answered to his expressed satisfaction, patient expresses he wishes to proceed, and informed consent was obtained.   - NPO + IVF resuscitation   - IV Abx (Zosyn)  - Monitor abdominal examination; on-going bowel function  - Pain control prn; antiemetics prn   - DVT prophylaxis; hold for OR  All of the above findings and recommendations were discussed with the patient, and all of his questions were answered to his expressed satisfaction.  -- Edison Simon, PA-C [facility] 05/19/2020, 7:34 AM [phone] M-F: 7am - 4pm

## 2020-05-19 NOTE — Anesthesia Procedure Notes (Signed)
Procedure Name: Intubation Date/Time: 05/19/2020 2:22 PM Performed by: Aline Brochure, CRNA Pre-anesthesia Checklist: Patient identified, Emergency Drugs available, Suction available and Patient being monitored Patient Re-evaluated:Patient Re-evaluated prior to induction Oxygen Delivery Method: Circle system utilized Preoxygenation: Pre-oxygenation with 100% oxygen Induction Type: IV induction Ventilation: Mask ventilation with difficulty Laryngoscope Size: McGraph and 4 Grade View: Grade I Tube type: Oral Tube size: 7.5 mm Number of attempts: 1 Airway Equipment and Method: Stylet and Video-laryngoscopy Placement Confirmation: ETT inserted through vocal cords under direct vision,  positive ETCO2 and breath sounds checked- equal and bilateral Secured at: 22 cm Tube secured with: Tape Dental Injury: Teeth and Oropharynx as per pre-operative assessment  Difficulty Due To: Difficult Airway- due to anterior larynx

## 2020-05-19 NOTE — Transfer of Care (Signed)
Immediate Anesthesia Transfer of Care Note  Patient: Rigby Leonhardt.  Procedure(s) Performed: APPENDECTOMY LAPAROSCOPIC (N/A )  Patient Location: PACU  Anesthesia Type:General  Level of Consciousness: drowsy  Airway & Oxygen Therapy: Patient Spontanous Breathing and Patient connected to face mask oxygen  Post-op Assessment: Report given to RN and Post -op Vital signs reviewed and stable  Post vital signs: Reviewed and stable  Last Vitals:  Vitals Value Taken Time  BP 95/57 05/19/20 1556  Temp    Pulse 72 05/19/20 1559  Resp 16 05/19/20 1559  SpO2 97 % 05/19/20 1559  Vitals shown include unvalidated device data.  Last Pain:  Vitals:   05/19/20 0922  TempSrc: Temporal  PainSc: 4          Complications: No complications documented.

## 2020-05-20 ENCOUNTER — Encounter: Payer: Self-pay | Admitting: Surgery

## 2020-05-20 LAB — GLUCOSE, CAPILLARY
Glucose-Capillary: 131 mg/dL — ABNORMAL HIGH (ref 70–99)
Glucose-Capillary: 118 mg/dL — ABNORMAL HIGH (ref 70–99)
Glucose-Capillary: 104 mg/dL — ABNORMAL HIGH (ref 70–99)
Glucose-Capillary: 105 mg/dL — ABNORMAL HIGH (ref 70–99)

## 2020-05-20 LAB — BASIC METABOLIC PANEL
Anion gap: 9 (ref 5–15)
BUN: 14 mg/dL (ref 6–20)
CO2: 23 mmol/L (ref 22–32)
Calcium: 7.9 mg/dL — ABNORMAL LOW (ref 8.9–10.3)
Chloride: 104 mmol/L (ref 98–111)
Creatinine, Ser: 1.4 mg/dL — ABNORMAL HIGH (ref 0.61–1.24)
GFR, Estimated: 60 mL/min — ABNORMAL LOW (ref >60)
Glucose, Bld: 153 mg/dL — ABNORMAL HIGH (ref 70–99)
Potassium: 3.8 mmol/L (ref 3.5–5.1)
Sodium: 136 mmol/L (ref 135–145)

## 2020-05-20 LAB — CBC
HCT: 32.6 % — ABNORMAL LOW (ref 39.0–52.0)
Hemoglobin: 10.8 g/dL — ABNORMAL LOW (ref 13.0–17.0)
MCH: 27.1 pg (ref 26.0–34.0)
MCHC: 33.1 g/dL (ref 30.0–36.0)
MCV: 81.7 fL (ref 80.0–100.0)
Platelets: 194 10*3/uL (ref 150–400)
RBC: 3.99 MIL/uL — ABNORMAL LOW (ref 4.22–5.81)
RDW: 14.7 % (ref 11.5–15.5)
WBC: 8 10*3/uL (ref 4.0–10.5)
nRBC: 0 % (ref 0.0–0.2)

## 2020-05-20 MED ORDER — HEPARIN SODIUM (PORCINE) 5000 UNIT/ML IJ SOLN
INTRAMUSCULAR | Status: AC
  Administered 2020-05-20: 5000 [IU] via SUBCUTANEOUS
  Filled 2020-05-20: qty 1

## 2020-05-20 MED ORDER — PIPERACILLIN-TAZOBACTAM 3.375 G IVPB
INTRAVENOUS | Status: AC
  Administered 2020-05-20: 3.375 g via INTRAVENOUS
  Filled 2020-05-20: qty 50

## 2020-05-20 MED ORDER — PIPERACILLIN-TAZOBACTAM 3.375 G IVPB
INTRAVENOUS | Status: AC
  Filled 2020-05-20: qty 50

## 2020-05-20 MED ORDER — OXYCODONE HCL 5 MG PO TABS
ORAL_TABLET | ORAL | Status: AC
  Administered 2020-05-20: 10 mg via ORAL
  Filled 2020-05-20: qty 2

## 2020-05-20 MED ORDER — INSULIN ASPART 100 UNIT/ML ~~LOC~~ SOLN
SUBCUTANEOUS | Status: AC
  Administered 2020-05-20: 3 [IU] via SUBCUTANEOUS
  Filled 2020-05-20: qty 1

## 2020-05-20 MED ORDER — ALPRAZOLAM 0.5 MG PO TABS
ORAL_TABLET | ORAL | Status: AC
  Filled 2020-05-20: qty 2

## 2020-05-20 MED ORDER — OXYCODONE HCL 5 MG PO TABS
ORAL_TABLET | ORAL | Status: AC
  Administered 2020-05-20: 5 mg via ORAL
  Filled 2020-05-20: qty 1

## 2020-05-20 MED ORDER — HEPARIN SODIUM (PORCINE) 5000 UNIT/ML IJ SOLN
INTRAMUSCULAR | Status: AC
  Filled 2020-05-20: qty 1

## 2020-05-20 MED ORDER — HEPARIN SODIUM (PORCINE) 5000 UNIT/ML IJ SOLN
5000.0000 [IU] | Freq: Three times a day (TID) | INTRAMUSCULAR | Status: DC
  Administered 2020-05-20: 5000 [IU] via SUBCUTANEOUS

## 2020-05-20 NOTE — Progress Notes (Signed)
[place] Hospital Day(s): 2.   Post op day(s): 1 Day Post-Op.   Interval History:  Patient seen and examined no acute events or new complaints overnight.  Patient reports he is feeling better this morning, improvement in symptoms No fever, chills, nausea, emesis Leukocytosis remains resolved, WBC 8.0K Renal function remains slightly bumped likely secondary to under resuscitation, sCr - 1.40, good UO No significant electrolyte derangements Tolerating diet; advancing  Vital signs in last 24 hours: [min-max] current  Temp:  [96.6 F (35.9 C)-98.9 F (37.2 C)] 97.5 F (36.4 C) (12/09 0725) Pulse Rate:  [67-86] 73 (12/09 0725) Resp:  [14-19] 18 (12/09 0725) BP: (95-128)/(57-67) 110/64 (12/09 0725) SpO2:  [92 %-98 %] 94 % (12/09 0725)     Height: 5\' 10"  (177.8 cm) Weight: 118.4 kg BMI (Calculated): 37.45   Intake/Output last 2 shifts:  12/08 0701 - 12/09 0700 In: 3687.9 [P.O.:480; I.V.:2552.5; IV Piggyback:655.4] Out: 1580 [Urine:1575; Blood:5]   Physical Exam:  Constitutional: alert, cooperative and no distress  Respiratory: breathing non-labored at rest  Cardiovascular: regular rate and sinus rhythm  Gastrointestinal: Soft, incisional soreness, non-distended, no rebound/guarding Integumentary: Laparoscopic incisions are CDI with dermabond, no erythema or drainage   Labs:  CBC Latest Ref Rng & Units 05/20/2020 05/19/2020 07/23/2019  WBC 4.0 - 10.5 K/uL 8.0 9.0 3.4(L)  Hemoglobin 13.0 - 17.0 g/dL 10.8(L) 11.3(L) 16.7  Hematocrit 39.0 - 52.0 % 32.6(L) 33.5(L) 47.2  Platelets 150 - 400 K/uL 194 184 160   CMP Latest Ref Rng & Units 05/20/2020 05/19/2020 07/23/2019  Glucose 70 - 99 mg/dL 153(H) 118(H) 283(H)  BUN 6 - 20 mg/dL 14 19 13   Creatinine 0.61 - 1.24 mg/dL 1.40(H) 1.32(H) 0.99  Sodium 135 - 145 mmol/L 136 136 136  Potassium 3.5 - 5.1 mmol/L 3.8 3.2(L) 3.3(L)  Chloride 98 - 111 mmol/L 104 101 102  CO2 22 - 32 mmol/L 23 27 24    Calcium 8.9 - 10.3 mg/dL 7.9(L) 8.5(L) 7.5(L)  Total Protein 6.5 - 8.1 g/dL - - -  Total Bilirubin 0.3 - 1.2 mg/dL - - -  Alkaline Phos 38 - 126 U/L - - -  AST 15 - 41 U/L - - -  ALT 0 - 44 U/L - - -     Imaging studies: No new pertinent imaging studies   Assessment/Plan:  54 y.o. male with slight AKI most likely secondary to under resuscitation 1 Day Post-Op s/p laparoscopic appendectomy for appendicitis.   - Okay to ADAT  - Will continue IVF resuscitation this morning to 100 ml/hr given AKI   - Continue IV Abx (Zosyn)   - Monitor abdominal examination   - Pain control prn; antiemetics prn  - Mobilization as tolerates  - Morning labs   - Discharge Planning: Pending improvement in renal function, anticipate ready for DC in the AM   All of the above findings and recommendations were discussed with the patient, and the medical team, and all of patient's questions were answered to his expressed satisfaction.  -- Edison Simon, PA-C [facility] 05/20/2020, 7:39 AM [phone] M-F: 7am - 4pm

## 2020-05-21 LAB — BASIC METABOLIC PANEL
Anion gap: 10 (ref 5–15)
BUN: 13 mg/dL (ref 6–20)
CO2: 24 mmol/L (ref 22–32)
Calcium: 8.2 mg/dL — ABNORMAL LOW (ref 8.9–10.3)
Chloride: 105 mmol/L (ref 98–111)
Creatinine, Ser: 1.25 mg/dL — ABNORMAL HIGH (ref 0.61–1.24)
GFR, Estimated: >60 mL/min (ref >60)
Glucose, Bld: 116 mg/dL — ABNORMAL HIGH (ref 70–99)
Potassium: 3.9 mmol/L (ref 3.5–5.1)
Sodium: 139 mmol/L (ref 135–145)

## 2020-05-21 LAB — GLUCOSE, CAPILLARY: Glucose-Capillary: 122 mg/dL — ABNORMAL HIGH (ref 70–99)

## 2020-05-21 MED ORDER — OXYCODONE HCL 5 MG PO TABS
5.0000 mg | ORAL_TABLET | Freq: Four times a day (QID) | ORAL | 0 refills | Status: AC | PRN
Start: 2020-05-21 — End: ?

## 2020-05-21 MED ORDER — PIPERACILLIN-TAZOBACTAM 3.375 G IVPB
INTRAVENOUS | Status: AC
  Administered 2020-05-21: 3.375 g via INTRAVENOUS
  Filled 2020-05-21: qty 50

## 2020-05-21 MED ORDER — HEPARIN SODIUM (PORCINE) 5000 UNIT/ML IJ SOLN
INTRAMUSCULAR | Status: AC
  Administered 2020-05-21: 5000 [IU] via SUBCUTANEOUS
  Filled 2020-05-21: qty 1

## 2020-05-21 MED ORDER — IBUPROFEN 600 MG PO TABS: 600.0000 mg | ORAL_TABLET | Freq: Four times a day (QID) | ORAL | 0 refills | Status: DC | PRN

## 2020-05-21 MED ORDER — AMOXICILLIN-POT CLAVULANATE 875-125 MG PO TABS: 1.0000 | ORAL_TABLET | Freq: Two times a day (BID) | ORAL | 0 refills | Status: AC

## 2020-05-21 NOTE — Discharge Instructions (Signed)
In addition to included general post-operative instructions for laparoscopic appendectomy,  Diet: Resume home diet.   Activity: No heavy lifting >20 pounds (children, pets, laundry, garbage) for 4 weeks, but light activity and walking are encouraged. Do not drive or drink alcohol if taking narcotic pain medications or having pain that might distract from driving.  Wound care: You may shower/get incision wet with soapy water and pat dry (do not rub incisions), but no baths or submerging incision underwater until follow-up.   Medications: Resume all home medications. For mild to moderate pain: acetaminophen (Tylenol) or ibuprofen/naproxen (if no kidney disease). Combining Tylenol with alcohol can substantially increase your risk of causing liver disease. Narcotic pain medications, if prescribed, can be used for severe pain, though may cause nausea, constipation, and drowsiness. Do not combine Tylenol and Percocet (or similar) within a 6 hour period as Percocet (and similar) contain(s) Tylenol. If you do not need the narcotic pain medication, you do not need to fill the prescription.  Call office [phone] / [phone]) at any time if any questions, worsening pain, fevers/chills, bleeding, drainage from incision site, or other concerns.

## 2020-05-21 NOTE — Discharge Summary (Signed)
[facility] SURGICAL DISCHARGE SUMMARY  Patient ID: John Stanton. MRN: [id number] DOB/AGE: 54-Apr-1967 54 y.o.  Admit date: 05/18/2020 Discharge date: 05/21/2020  Discharge Diagnoses Patient Active Problem List   Diagnosis Date Noted  . Acute appendicitis 05/18/2020   Consultants None  Procedures 05/19/2020:  Laparoscopic Appendectomy   HPI: 54 y.o. male presented to [facility] ED yesterday for abdominal pain. Patient initially presented to his PCP office yesterday (12/07) with complaints of RLQ abdominal pain over the course of the previous 48 hours. This was localized in his RLQ, described as sharp, and exacerbated with movements. He noted some associated nausea and emesis with the abdominal pain. He denied any history of similar pain in the past. Only previous intra-abdominal surgery is laparoscopic cholecystectomy in 2020 with Dr Lysle Pearl. Work up from his PCP office revealed normal WBC at 10.2K and was otherwise unremarkable. He did undergo CT Abdomen/Pelvis which was concerning for acute uncomplicated appendicitis. He then presented to the ED for evaluation and admission.   Hospital Course: Informed consent was obtained and documented, and patient underwent uneventful laparoscopic appendectomy (Dr Hampton Abbot, 05/19/2020).  Post-operatively, he did have issues with increase in sCr likely secondary to under resuscitation, which improved on POD1. Advancement of patient's diet and ambulation were well-tolerated. The remainder of patient's hospital course was essentially unremarkable, and discharge planning was initiated accordingly with patient safely able to be discharged home with appropriate discharge instructions, antibiotics (Augmentin x10 days), pain control, and outpatient follow-up after all of his questions were answered to his expressed satisfaction.   Discharge Condition: Good    Physical Examination:  Constitutional: alert, cooperative and no distress   Respiratory: breathing non-labored at rest  Cardiovascular: regular rate and sinus rhythm  Gastrointestinal: Soft, incisional soreness, non-distended, no rebound/guarding Integumentary: Laparoscopic incisions are CDI with dermabond, no erythema or drainage     Allergies as of 05/21/2020      Reactions   Gabapentin Other (See Comments)   Glipizide Other (See Comments), Nausea Only   Infliximab Other (See Comments)   Methotrexate Other (See Comments)   Venlafaxine Other (See Comments)   ELEVATED BP   Prednisone Rash      Medication List    TAKE these medications   ALPRAZolam 0.5 MG tablet Commonly known as: XANAX Take 0.5-1 mg by mouth at bedtime as needed for sleep. for sleep   amoxicillin-clavulanate 875-125 MG tablet Commonly known as: Augmentin Take 1 tablet by mouth 2 (two) times daily for 10 days.   bisoprolol-hydrochlorothiazide 5-6.25 MG tablet Commonly known as: ZIAC Take 1 tablet by mouth daily.   DULoxetine 60 MG capsule Commonly known as: CYMBALTA Take 60 mg by mouth daily.   ibuprofen 600 MG tablet Commonly known as: ADVIL Take 1 tablet (600 mg total) by mouth every 6 (six) hours as needed.   losartan 100 MG tablet Commonly known as: COZAAR Take 100 mg by mouth daily.   metFORMIN 500 MG tablet Commonly known as: Glucophage Take 1 tablet (500 mg total) by mouth 2 (two) times daily with a meal.   oxyCODONE 5 MG immediate release tablet Commonly known as: Oxy IR/ROXICODONE Take 1 tablet (5 mg total) by mouth every 6 (six) hours as needed for severe pain or breakthrough pain.   pravastatin 40 MG tablet Commonly known as: PRAVACHOL Take 40 mg by mouth daily.   Xeljanz 5 MG Tabs Generic drug: Tofacitinib Citrate Take 5 mg by mouth 2 (two) times daily.  Follow-up Information    Olean Ree, MD Follow up.   Specialty: General Surgery Contact information: [address] [place] [address] [phone]                 Time spent on discharge management including discussion of hospital course, clinical condition, outpatient instructions, prescriptions, and follow up with the patient and members of the medical team: >30 minutes  -- Edison Simon , PA-C [facility]  05/21/2020, 8:48 AM [phone] M-F: 7am - 4pm

## 2020-05-26 LAB — SURGICAL PATHOLOGY

## 2020-05-31 ENCOUNTER — Telehealth: Payer: Self-pay

## 2020-05-31 ENCOUNTER — Other Ambulatory Visit: Payer: Self-pay

## 2020-05-31 ENCOUNTER — Ambulatory Visit (INDEPENDENT_AMBULATORY_CARE_PROVIDER_SITE_OTHER): Payer: Medicare Other | Admitting: Physician Assistant

## 2020-05-31 ENCOUNTER — Encounter: Payer: Self-pay | Admitting: Physician Assistant

## 2020-05-31 VITALS — BP 123/79 | HR 73 | Temp 97.5°F | Ht 70.0 in | Wt 264.2 lb

## 2020-05-31 DIAGNOSIS — Z09 Encounter for follow-up examination after completed treatment for conditions other than malignant neoplasm: Secondary | ICD-10-CM

## 2020-05-31 DIAGNOSIS — K353 Acute appendicitis with localized peritonitis, without perforation or gangrene: Secondary | ICD-10-CM

## 2020-05-31 NOTE — Telephone Encounter (Signed)
Per Dr.Piscoya-would like for patient to be seen by Zach-I let the patient know we could add him to today's schedule at 10:30-Had Laparoscopic Appendectomy 05/19/20-appointment has not been made or confirmed.

## 2020-05-31 NOTE — Patient Instructions (Addendum)
Finish the antibiotics. If you have any concerns or questions, feel free to call our office.   GENERAL POST-OPERATIVE PATIENT INSTRUCTIONS   WOUND CARE INSTRUCTIONS:  Keep a dry clean dressing on the wound if there is drainage. The initial bandage may be removed after 24 hours.  Once the wound has quit draining you may leave it open to air.  If clothing rubs against the wound or causes irritation and the wound is not draining you may cover it with a dry dressing during the daytime.  Try to keep the wound dry and avoid ointments on the wound unless directed to do so.  If the wound becomes bright red and painful or starts to drain infected material that is not clear, please contact your physician immediately.  If the wound is mildly pink and has a thick firm ridge underneath it, this is normal, and is referred to as a healing ridge.  This will resolve over the next 4-6 weeks.  BATHING: You may shower if you have been informed of this by your surgeon. However, Please do not submerge in a tub, hot tub, or pool until incisions are completely sealed or have been told by your surgeon that you may do so.  DIET:  You may eat any foods that you can tolerate.  It is a good idea to eat a high fiber diet and take in plenty of fluids to prevent constipation.  If you do become constipated you may want to take a mild laxative or take ducolax tablets on a daily basis until your bowel habits are regular.  Constipation can be very uncomfortable, along with straining, after recent surgery.  ACTIVITY:  You are encouraged to cough and deep breath or use your incentive spirometer if you were given one, every 15-30 minutes when awake.  This will help prevent respiratory complications and low grade fevers post-operatively if you had a general anesthetic.  You may want to hug a pillow when coughing and sneezing to add additional support to the surgical area, if you had abdominal or chest surgery, which will decrease pain during  these times.  You are encouraged to walk and engage in light activity for the next two weeks.  You should not lift more than 15 pounds,  until 06/14/2020 as it could put you at increased risk for complications.  Twenty pounds is roughly equivalent to a plastic bag of groceries. At that time- Listen to your body when lifting, if you have pain when lifting, stop and then try again in a few days. Soreness after doing exercises or activities of daily living is normal as you get back in to your normal routine.  MEDICATIONS:  Try to take narcotic medications and anti-inflammatory medications, such as tylenol, ibuprofen, naprosyn, etc., with food.  This will minimize stomach upset from the medication.  Should you develop nausea and vomiting from the pain medication, or develop a rash, please discontinue the medication and contact your physician.  You should not drive, make important decisions, or operate machinery when taking narcotic pain medication.  SUNBLOCK Use sun block to incision area over the next year if this area will be exposed to sun. This helps decrease scarring and will allow you avoid a permanent darkened area over your incision.

## 2020-05-31 NOTE — Progress Notes (Signed)
[facility] POST-OP OFFICE VISIT  05/31/2020  HPI: John Stanton. is a 54 y.o. male 12 days s/p laparoscopic appendectomy for acute appendicitis with Dr Hampton Abbot.   He reports that he is doing well No issues with abdominal pain, nausea, emesis, fever, chills Tolerating PO; no issues with bowel function No issues with incisions No issues with Abx  Vital signs: BP 123/79   Pulse 73   Temp (!) 97.5 F (36.4 C) (Oral)   Ht 5\' 10"  (1.778 m)   Wt 264 lb 3.2 oz (119.8 kg)   SpO2 97%   BMI 37.91 kg/m    Physical Exam: Constitutional: Well appearing male, NAD Abdomen: Soft, non-tender, non-distended, no rebound/guarding Skin: laparoscopic incisions have healed well, no erythema or drainage   Assessment/Plan: This is a 54 y.o. male 12 days s/p laparoscopic appendectomy for acute appendicitis   - Pain control prn  - Complete ABx as prescribed  - Reviewed wound care and lifting restrictions  - Reviewed pathology: Acute appendicitis, extravasated mucin, negative for malignancy --> He is reportedly up to date on colonoscopies  - He will rtc on an as needed basis at this point  -- Edison Simon, PA-C [facility] 05/31/2020, 10:37 AM [phone] M-F: 7am - 4pm

## 2021-07-21 ENCOUNTER — Other Ambulatory Visit: Payer: Self-pay

## 2021-07-21 ENCOUNTER — Encounter (INDEPENDENT_AMBULATORY_CARE_PROVIDER_SITE_OTHER): Payer: Medicare Other | Admitting: Ophthalmology

## 2021-07-21 DIAGNOSIS — H59032 Cystoid macular edema following cataract surgery, left eye: Secondary | ICD-10-CM | POA: Diagnosis not present

## 2021-07-21 DIAGNOSIS — H43813 Vitreous degeneration, bilateral: Secondary | ICD-10-CM

## 2021-07-21 DIAGNOSIS — I10 Essential (primary) hypertension: Secondary | ICD-10-CM | POA: Diagnosis not present

## 2021-07-21 DIAGNOSIS — H35033 Hypertensive retinopathy, bilateral: Secondary | ICD-10-CM | POA: Diagnosis not present

## 2021-07-21 DIAGNOSIS — H2511 Age-related nuclear cataract, right eye: Secondary | ICD-10-CM

## 2021-08-04 ENCOUNTER — Other Ambulatory Visit: Payer: Self-pay

## 2021-08-04 ENCOUNTER — Encounter (INDEPENDENT_AMBULATORY_CARE_PROVIDER_SITE_OTHER): Payer: Medicare Other | Admitting: Ophthalmology

## 2021-08-04 DIAGNOSIS — H2511 Age-related nuclear cataract, right eye: Secondary | ICD-10-CM

## 2021-08-04 DIAGNOSIS — H35033 Hypertensive retinopathy, bilateral: Secondary | ICD-10-CM

## 2021-08-04 DIAGNOSIS — I10 Essential (primary) hypertension: Secondary | ICD-10-CM | POA: Diagnosis not present

## 2021-08-04 DIAGNOSIS — H43813 Vitreous degeneration, bilateral: Secondary | ICD-10-CM | POA: Diagnosis not present

## 2021-08-04 DIAGNOSIS — H59032 Cystoid macular edema following cataract surgery, left eye: Secondary | ICD-10-CM

## 2021-09-01 ENCOUNTER — Encounter (INDEPENDENT_AMBULATORY_CARE_PROVIDER_SITE_OTHER): Payer: Medicare Other | Admitting: Ophthalmology

## 2021-09-01 ENCOUNTER — Other Ambulatory Visit: Payer: Self-pay

## 2021-09-01 DIAGNOSIS — I10 Essential (primary) hypertension: Secondary | ICD-10-CM | POA: Diagnosis not present

## 2021-09-01 DIAGNOSIS — H35033 Hypertensive retinopathy, bilateral: Secondary | ICD-10-CM | POA: Diagnosis not present

## 2021-09-01 DIAGNOSIS — H59032 Cystoid macular edema following cataract surgery, left eye: Secondary | ICD-10-CM

## 2021-09-01 DIAGNOSIS — H2511 Age-related nuclear cataract, right eye: Secondary | ICD-10-CM

## 2021-09-01 DIAGNOSIS — H43813 Vitreous degeneration, bilateral: Secondary | ICD-10-CM | POA: Diagnosis not present

## 2021-10-06 ENCOUNTER — Encounter (INDEPENDENT_AMBULATORY_CARE_PROVIDER_SITE_OTHER): Payer: Medicare Other | Admitting: Ophthalmology

## 2021-10-28 ENCOUNTER — Other Ambulatory Visit: Payer: Self-pay | Admitting: Internal Medicine

## 2021-10-28 DIAGNOSIS — R1310 Dysphagia, unspecified: Secondary | ICD-10-CM

## 2021-10-28 DIAGNOSIS — R1012 Left upper quadrant pain: Secondary | ICD-10-CM

## 2021-11-10 ENCOUNTER — Ambulatory Visit: Payer: Medicare Other

## 2021-11-10 ENCOUNTER — Other Ambulatory Visit: Payer: Medicaid Other

## 2022-05-12 ENCOUNTER — Other Ambulatory Visit: Payer: Self-pay

## 2022-05-12 ENCOUNTER — Emergency Department: Payer: Medicare Other

## 2022-05-12 ENCOUNTER — Emergency Department
Admission: EM | Admit: 2022-05-12 | Discharge: 2022-05-12 | Discharge status: Against Medical Advice | Payer: Medicare Other | Attending: Emergency Medicine | Admitting: Emergency Medicine

## 2022-05-12 DIAGNOSIS — R739 Hyperglycemia, unspecified: Secondary | ICD-10-CM | POA: Insufficient documentation

## 2022-05-12 DIAGNOSIS — R0602 Shortness of breath: Secondary | ICD-10-CM | POA: Insufficient documentation

## 2022-05-12 DIAGNOSIS — Z5321 Procedure and treatment not carried out due to patient leaving prior to being seen by health care provider: Secondary | ICD-10-CM | POA: Diagnosis not present

## 2022-05-12 DIAGNOSIS — Z1152 Encounter for screening for COVID-19: Secondary | ICD-10-CM | POA: Insufficient documentation

## 2022-05-12 LAB — CBC WITH DIFFERENTIAL/PLATELET
Abs Immature Granulocytes: 0.01 10*3/uL (ref 0.00–0.07)
Basophils Absolute: 0.1 10*3/uL (ref 0.0–0.1)
Basophils Relative: 2 %
Eosinophils Absolute: 0.2 10*3/uL (ref 0.0–0.5)
Eosinophils Relative: 4 %
HCT: 42.8 % (ref 39.0–52.0)
Hemoglobin: 14.3 g/dL (ref 13.0–17.0)
Immature Granulocytes: 0 %
Lymphocytes Relative: 17 %
Lymphs Abs: 0.9 10*3/uL (ref 0.7–4.0)
MCH: 27.1 pg (ref 26.0–34.0)
MCHC: 33.4 g/dL (ref 30.0–36.0)
MCV: 81.2 fL (ref 80.0–100.0)
Monocytes Absolute: 0.6 10*3/uL (ref 0.1–1.0)
Monocytes Relative: 13 %
Neutro Abs: 3.2 10*3/uL (ref 1.7–7.7)
Neutrophils Relative %: 64 %
Platelets: 303 10*3/uL (ref 150–400)
RBC: 5.27 MIL/uL (ref 4.22–5.81)
RDW: 14.8 % (ref 11.5–15.5)
WBC: 5 10*3/uL (ref 4.0–10.5)
nRBC: 0 % (ref 0.0–0.2)

## 2022-05-12 LAB — COMPREHENSIVE METABOLIC PANEL
ALT: 45 U/L — ABNORMAL HIGH (ref 0–44)
AST: 36 U/L (ref 15–41)
Albumin: 3.7 g/dL (ref 3.5–5.0)
Alkaline Phosphatase: 115 U/L (ref 38–126)
Anion gap: 11 (ref 5–15)
BUN: 12 mg/dL (ref 6–20)
CO2: 24 mmol/L (ref 22–32)
Calcium: 8.9 mg/dL (ref 8.9–10.3)
Chloride: 99 mmol/L (ref 98–111)
Creatinine, Ser: 1.22 mg/dL (ref 0.61–1.24)
GFR, Estimated: >60 mL/min (ref >60)
Glucose, Bld: 487 mg/dL — ABNORMAL HIGH (ref 70–99)
Potassium: 3.9 mmol/L (ref 3.5–5.1)
Sodium: 134 mmol/L — ABNORMAL LOW (ref 135–145)
Total Bilirubin: 1.5 mg/dL — ABNORMAL HIGH (ref 0.3–1.2)
Total Protein: 7 g/dL (ref 6.5–8.1)

## 2022-05-12 LAB — URINALYSIS, ROUTINE W REFLEX MICROSCOPIC
Bacteria, UA: NONE SEEN
Bilirubin Urine: NEGATIVE
Glucose, UA: >=500 mg/dL — AB
Hgb urine dipstick: NEGATIVE
Ketones, ur: NEGATIVE mg/dL
Leukocytes,Ua: NEGATIVE
Nitrite: NEGATIVE
Protein, ur: NEGATIVE mg/dL
Specific Gravity, Urine: 1.033 — ABNORMAL HIGH (ref 1.005–1.030)
Squamous Epithelial / HPF: NONE SEEN (ref 0–5)
pH: 5 (ref 5.0–8.0)

## 2022-05-12 LAB — CBG MONITORING, ED: Glucose-Capillary: 504 mg/dL (ref 70–99)

## 2022-05-12 LAB — TROPONIN I (HIGH SENSITIVITY): Troponin I (High Sensitivity): 7 ng/L (ref <18)

## 2022-05-12 LAB — RESP PANEL BY RT-PCR (FLU A&B, COVID) ARPGX2
Influenza A by PCR: NEGATIVE
Influenza B by PCR: NEGATIVE
SARS Coronavirus 2 by RT PCR: NEGATIVE

## 2022-05-12 LAB — BETA-HYDROXYBUTYRIC ACID: Beta-Hydroxybutyric Acid: 0.15 mmol/L (ref 0.05–0.27)

## 2022-05-12 LAB — LIPASE, BLOOD: Lipase: 116 U/L — ABNORMAL HIGH (ref 11–51)

## 2022-05-12 LAB — BRAIN NATRIURETIC PEPTIDE: B Natriuretic Peptide: 12.3 pg/mL (ref 0.0–100.0)

## 2022-05-12 NOTE — ED Provider Triage Note (Signed)
Emergency Medicine Provider Triage Evaluation Note  John Stanton. , a 56 y.o. male  was evaluated in triage.  Pt complains of hyperglycemia.  He presents to the ED at the advice of Dr. Doy Hutching who evaluated him earlier.  John Stanton reported elevated blood sugar greater than 500.  Patient endorses some tunnel vision and pressure in his head.  He is at 2 weeks worth of symptoms since he was diagnosed with a viral upper respiratory infection.  He admits to taking antibiotic course as well as prednisone in the last 2 weeks.  Review of Systems  Positive: hyperglycemia Negative: CP, SOB  Physical Exam  BP (!) 137/90 (BP Location: Left Arm)   Pulse (!) 119   Temp 98.6 F (37 C) (Oral)   Resp 18   Ht '5\' 10"'$  (1.778 m)   Wt 119.7 kg   SpO2 97%   BMI 37.88 kg/m  Gen:   Awake, no distress  NAD Resp:  Normal effort CTA MSK:   Moves extremities without difficulty  Other:    Medical Decision Making  Medically screening exam initiated at 6:33 PM.  Appropriate orders placed.  John Stanton. was informed that the remainder of the evaluation will be completed by another provider, this initial triage assessment does not replace that evaluation, and the importance of remaining in the ED until their evaluation is complete.  Patient with type 2 diabetes presents to the ED for evaluation of hyperglycemia.  Patient admits to poor compliance with his metformin.  He also endorses a recent course of prednisone for his URI.    Melvenia Needles, PA-C 05/12/22 [phone]

## 2022-05-12 NOTE — ED Triage Notes (Signed)
Reports dr sparks told him he needs to come to ER bc glucose was over 500.  CBG 504 in triage.  Complains of tunnel vision and pressure on his head.  Reports symptoms x a couple weeks.  Rpeorts had RSV a couple weeks ago and has been going on since.

## 2022-05-16 ENCOUNTER — Other Ambulatory Visit: Payer: Self-pay | Admitting: Internal Medicine

## 2022-05-16 DIAGNOSIS — K8500 Idiopathic acute pancreatitis without necrosis or infection: Secondary | ICD-10-CM

## 2022-05-16 DIAGNOSIS — E118 Type 2 diabetes mellitus with unspecified complications: Secondary | ICD-10-CM

## 2022-05-19 ENCOUNTER — Ambulatory Visit
Admission: RE | Admit: 2022-05-19 | Discharge: 2022-05-19 | Disposition: A | Discharge status: Home / Self Care | Payer: Medicare Other | Source: Ambulatory Visit | Attending: Internal Medicine | Admitting: Internal Medicine

## 2022-05-19 DIAGNOSIS — E118 Type 2 diabetes mellitus with unspecified complications: Secondary | ICD-10-CM | POA: Diagnosis present

## 2022-05-19 DIAGNOSIS — K8500 Idiopathic acute pancreatitis without necrosis or infection: Secondary | ICD-10-CM | POA: Diagnosis present

## 2022-05-19 LAB — BLOOD GAS, VENOUS
Acid-Base Excess: 2.2 mmol/L — ABNORMAL HIGH (ref 0.0–2.0)
Bicarbonate: 27.8 mmol/L (ref 20.0–28.0)
O2 Saturation: 66.4 %
Patient temperature: 37
pCO2, Ven: 46 mmHg (ref 44–60)
pH, Ven: 7.39 (ref 7.25–7.43)
pO2, Ven: 38 mmHg (ref 32–45)

## 2022-07-25 ENCOUNTER — Encounter (INDEPENDENT_AMBULATORY_CARE_PROVIDER_SITE_OTHER): Payer: Medicare Other | Admitting: Ophthalmology

## 2022-07-25 DIAGNOSIS — H43813 Vitreous degeneration, bilateral: Secondary | ICD-10-CM

## 2022-07-25 DIAGNOSIS — I10 Essential (primary) hypertension: Secondary | ICD-10-CM | POA: Diagnosis not present

## 2022-07-25 DIAGNOSIS — E113292 Type 2 diabetes mellitus with mild nonproliferative diabetic retinopathy without macular edema, left eye: Secondary | ICD-10-CM | POA: Diagnosis not present

## 2022-07-25 DIAGNOSIS — H35033 Hypertensive retinopathy, bilateral: Secondary | ICD-10-CM | POA: Diagnosis not present

## 2022-07-25 DIAGNOSIS — H26493 Other secondary cataract, bilateral: Secondary | ICD-10-CM

## 2022-08-07 ENCOUNTER — Encounter (INDEPENDENT_AMBULATORY_CARE_PROVIDER_SITE_OTHER): Payer: Medicare Other | Admitting: Ophthalmology

## 2022-08-07 DIAGNOSIS — H26491 Other secondary cataract, right eye: Secondary | ICD-10-CM | POA: Diagnosis not present

## 2022-09-04 ENCOUNTER — Encounter (INDEPENDENT_AMBULATORY_CARE_PROVIDER_SITE_OTHER): Payer: Medicare Other | Admitting: Ophthalmology
